# Patient Record
Sex: Male | Born: 1960 | Race: Black or African American | Hispanic: No | Marital: Married | Smoking: Former smoker
Health system: Southern US, Community
[De-identification: ages and names within clinical notes are randomized; demographics above are authoritative.]

## PROBLEM LIST (undated history)

## (undated) DIAGNOSIS — N529 Male erectile dysfunction, unspecified: Secondary | ICD-10-CM

## (undated) DIAGNOSIS — N5089 Other specified disorders of the male genital organs: Secondary | ICD-10-CM

## (undated) DIAGNOSIS — I10 Essential (primary) hypertension: Secondary | ICD-10-CM

## (undated) DIAGNOSIS — E785 Hyperlipidemia, unspecified: Secondary | ICD-10-CM

## (undated) DIAGNOSIS — Z9189 Other specified personal risk factors, not elsewhere classified: Secondary | ICD-10-CM

---

## 1997-10-09 ENCOUNTER — Emergency Department (HOSPITAL_COMMUNITY): Admission: EM | Admit: 1997-10-09 | Discharge: 1997-10-09 | Payer: Self-pay | Admitting: Emergency Medicine

## 2000-10-16 ENCOUNTER — Emergency Department (HOSPITAL_COMMUNITY): Admission: EM | Admit: 2000-10-16 | Discharge: 2000-10-16 | Payer: Self-pay | Admitting: *Deleted

## 2003-03-03 HISTORY — PX: KNEE ARTHROSCOPY: SUR90

## 2004-10-29 ENCOUNTER — Encounter: Admission: RE | Admit: 2004-10-29 | Discharge: 2005-01-27 | Payer: Self-pay | Admitting: Internal Medicine

## 2008-03-02 HISTORY — PX: WISDOM TOOTH EXTRACTION: SHX21

## 2013-09-18 ENCOUNTER — Other Ambulatory Visit: Payer: Self-pay | Admitting: Urology

## 2013-10-02 ENCOUNTER — Encounter (HOSPITAL_BASED_OUTPATIENT_CLINIC_OR_DEPARTMENT_OTHER): Payer: Self-pay | Admitting: *Deleted

## 2013-10-02 NOTE — Progress Notes (Signed)
NPO AFTER MN. ARRIVE AT 1030. NEEDS ISTAT AND EKG.  

## 2013-10-02 NOTE — Progress Notes (Signed)
10/02/13 1308  OBSTRUCTIVE SLEEP APNEA  Have you ever been diagnosed with sleep apnea through a sleep study? No  Do you snore loudly (loud enough to be heard through closed doors)?  0  Do you often feel tired, fatigued, or sleepy during the daytime? 0  Has anyone observed you stop breathing during your sleep? 0  Do you have, or are you being treated for high blood pressure? 1  BMI more than 35 kg/m2? 0  Age over [address]956 years old? 1  Neck circumference greater than 40 cm/16 inches? 1  Gender: 1  Obstructive Sleep Apnea Score 4  Score 4 or greater  Results sent to PCP

## 2013-10-06 ENCOUNTER — Ambulatory Visit (HOSPITAL_BASED_OUTPATIENT_CLINIC_OR_DEPARTMENT_OTHER): Payer: BC Managed Care – PPO | Admitting: Anesthesiology

## 2013-10-06 ENCOUNTER — Other Ambulatory Visit: Payer: Self-pay

## 2013-10-06 ENCOUNTER — Encounter (HOSPITAL_BASED_OUTPATIENT_CLINIC_OR_DEPARTMENT_OTHER): Payer: Self-pay | Admitting: *Deleted

## 2013-10-06 ENCOUNTER — Encounter (HOSPITAL_BASED_OUTPATIENT_CLINIC_OR_DEPARTMENT_OTHER): Payer: BC Managed Care – PPO | Admitting: Anesthesiology

## 2013-10-06 ENCOUNTER — Ambulatory Visit (HOSPITAL_BASED_OUTPATIENT_CLINIC_OR_DEPARTMENT_OTHER)
Admission: RE | Admit: 2013-10-06 | Discharge: 2013-10-06 | Disposition: A | Discharge status: Home / Self Care | Payer: BC Managed Care – PPO | Source: Ambulatory Visit | Attending: Urology | Admitting: Urology

## 2013-10-06 ENCOUNTER — Encounter (HOSPITAL_BASED_OUTPATIENT_CLINIC_OR_DEPARTMENT_OTHER)
Admission: RE | Disposition: A | Discharge status: Home / Self Care | Payer: Self-pay | Source: Ambulatory Visit | Attending: Urology

## 2013-10-06 DIAGNOSIS — N433 Hydrocele, unspecified: Secondary | ICD-10-CM | POA: Diagnosis not present

## 2013-10-06 DIAGNOSIS — N5089 Other specified disorders of the male genital organs: Secondary | ICD-10-CM | POA: Diagnosis not present

## 2013-10-06 DIAGNOSIS — E785 Hyperlipidemia, unspecified: Secondary | ICD-10-CM | POA: Insufficient documentation

## 2013-10-06 DIAGNOSIS — F17200 Nicotine dependence, unspecified, uncomplicated: Secondary | ICD-10-CM | POA: Diagnosis not present

## 2013-10-06 DIAGNOSIS — N508 Other specified disorders of male genital organs: Secondary | ICD-10-CM | POA: Diagnosis present

## 2013-10-06 DIAGNOSIS — I10 Essential (primary) hypertension: Secondary | ICD-10-CM | POA: Insufficient documentation

## 2013-10-06 HISTORY — DX: Other specified disorders of the male genital organs: N50.89

## 2013-10-06 HISTORY — DX: Essential (primary) hypertension: I10

## 2013-10-06 HISTORY — PX: SCROTAL EXPLORATION: SHX2386

## 2013-10-06 HISTORY — DX: Hyperlipidemia, unspecified: E78.5

## 2013-10-06 HISTORY — DX: Other specified personal risk factors, not elsewhere classified: Z91.89

## 2013-10-06 SURGERY — EXPLORATION, SCROTUM
Anesthesia: General | Site: Groin | Laterality: Left

## 2013-10-06 MED ORDER — OXYCODONE-ACETAMINOPHEN 5-325 MG PO TABS: 1.0000 | ORAL_TABLET | ORAL | Status: DC | PRN

## 2013-10-06 MED ORDER — ACETAMINOPHEN 10 MG/ML IV SOLN
INTRAVENOUS | Status: DC | PRN
  Administered 2013-10-06: 1000 mg via INTRAVENOUS

## 2013-10-06 MED ORDER — FENTANYL CITRATE 0.05 MG/ML IJ SOLN
INTRAMUSCULAR | Status: DC | PRN
  Administered 2013-10-06 (×4): 50 ug via INTRAVENOUS

## 2013-10-06 MED ORDER — OXYCODONE HCL 5 MG PO TABS
5.0000 mg | ORAL_TABLET | ORAL | Status: DC | PRN
  Filled 2013-10-06: qty 2

## 2013-10-06 MED ORDER — LACTATED RINGERS IV SOLN
INTRAVENOUS | Status: DC
  Administered 2013-10-06: 11:00:00 via INTRAVENOUS
  Filled 2013-10-06: qty 1000

## 2013-10-06 MED ORDER — FENTANYL CITRATE 0.05 MG/ML IJ SOLN
25.0000 ug | INTRAMUSCULAR | Status: DC | PRN
  Filled 2013-10-06: qty 1

## 2013-10-06 MED ORDER — KETOROLAC TROMETHAMINE 30 MG/ML IJ SOLN
INTRAMUSCULAR | Status: DC | PRN
  Administered 2013-10-06: 30 mg via INTRAVENOUS

## 2013-10-06 MED ORDER — ACETAMINOPHEN 325 MG PO TABS
650.0000 mg | ORAL_TABLET | ORAL | Status: DC | PRN
  Filled 2013-10-06: qty 2

## 2013-10-06 MED ORDER — LACTATED RINGERS IV SOLN
INTRAVENOUS | Status: DC
  Filled 2013-10-06: qty 1000

## 2013-10-06 MED ORDER — PROPOFOL 10 MG/ML IV BOLUS
INTRAVENOUS | Status: DC | PRN
  Administered 2013-10-06: 200 mg via INTRAVENOUS

## 2013-10-06 MED ORDER — SODIUM CHLORIDE 0.9 % IJ SOLN
3.0000 mL | INTRAMUSCULAR | Status: DC | PRN
  Filled 2013-10-06: qty 3

## 2013-10-06 MED ORDER — SODIUM CHLORIDE 0.9 % IV SOLN
250.0000 mL | INTRAVENOUS | Status: DC | PRN
  Filled 2013-10-06: qty 250

## 2013-10-06 MED ORDER — DEXAMETHASONE SODIUM PHOSPHATE 4 MG/ML IJ SOLN
INTRAMUSCULAR | Status: DC | PRN
  Administered 2013-10-06: 10 mg via INTRAVENOUS

## 2013-10-06 MED ORDER — LIDOCAINE HCL (CARDIAC) 20 MG/ML IV SOLN
INTRAVENOUS | Status: DC | PRN
  Administered 2013-10-06: 100 mg via INTRAVENOUS

## 2013-10-06 MED ORDER — MORPHINE SULFATE 2 MG/ML IJ SOLN
2.0000 mg | INTRAMUSCULAR | Status: DC | PRN
  Filled 2013-10-06: qty 1

## 2013-10-06 MED ORDER — FENTANYL CITRATE 0.05 MG/ML IJ SOLN
INTRAMUSCULAR | Status: AC
  Filled 2013-10-06: qty 4

## 2013-10-06 MED ORDER — SODIUM CHLORIDE 0.9 % IJ SOLN
3.0000 mL | Freq: Two times a day (BID) | INTRAMUSCULAR | Status: DC
  Filled 2013-10-06: qty 3

## 2013-10-06 MED ORDER — MIDAZOLAM HCL 5 MG/5ML IJ SOLN
INTRAMUSCULAR | Status: DC | PRN
  Administered 2013-10-06: 2 mg via INTRAVENOUS

## 2013-10-06 MED ORDER — MIDAZOLAM HCL 2 MG/2ML IJ SOLN
INTRAMUSCULAR | Status: AC
Start: 2013-10-06 — End: 2013-10-06
  Filled 2013-10-06: qty 2

## 2013-10-06 MED ORDER — ACETAMINOPHEN 650 MG RE SUPP
650.0000 mg | RECTAL | Status: DC | PRN
  Filled 2013-10-06: qty 1

## 2013-10-06 MED ORDER — CEFAZOLIN SODIUM 1-5 GM-% IV SOLN
1.0000 g | INTRAVENOUS | Status: DC
  Filled 2013-10-06: qty 50

## 2013-10-06 MED ORDER — CEFAZOLIN SODIUM-DEXTROSE 2-3 GM-% IV SOLR
2.0000 g | INTRAVENOUS | Status: AC
  Administered 2013-10-06: 2 g via INTRAVENOUS
  Filled 2013-10-06: qty 50

## 2013-10-06 MED ORDER — 0.9 % SODIUM CHLORIDE (POUR BTL) OPTIME
TOPICAL | Status: DC | PRN
  Administered 2013-10-06: 1000 mL

## 2013-10-06 MED ORDER — BUPIVACAINE HCL (PF) 0.25 % IJ SOLN
INTRAMUSCULAR | Status: DC | PRN
  Administered 2013-10-06: 10 mL

## 2013-10-06 MED ORDER — ONDANSETRON HCL 4 MG/2ML IJ SOLN
INTRAMUSCULAR | Status: DC | PRN
  Administered 2013-10-06: 4 mg via INTRAVENOUS

## 2013-10-06 SURGICAL SUPPLY — 61 items
ADH SKN CLS APL DERMABOND .7 (GAUZE/BANDAGES/DRESSINGS)
APL SKNCLS STERI-STRIP NONHPOA (GAUZE/BANDAGES/DRESSINGS)
APPLICATOR COTTON TIP 6IN STRL (MISCELLANEOUS) IMPLANT
BENZOIN TINCTURE PRP APPL 2/3 (GAUZE/BANDAGES/DRESSINGS) IMPLANT
BLADE CLIPPER SURG (BLADE) ×4 IMPLANT
BLADE SURG 15 STRL LF DISP TIS (BLADE) ×2 IMPLANT
BLADE SURG 15 STRL SS (BLADE) ×4
BNDG GAUZE ELAST 4 BULKY (GAUZE/BANDAGES/DRESSINGS) ×4 IMPLANT
CANISTER SUCTION 1200CC (MISCELLANEOUS) ×1 IMPLANT
CLEANER CAUTERY TIP 5X5 PAD (MISCELLANEOUS) ×2 IMPLANT
CLOSURE WOUND 1/2 X4 (GAUZE/BANDAGES/DRESSINGS)
CLOTH BEACON ORANGE TIMEOUT ST (SAFETY) ×1 IMPLANT
COVER MAYO STAND STRL (DRAPES) ×4 IMPLANT
COVER TABLE BACK 60X90 (DRAPES) ×4 IMPLANT
DERMABOND ADVANCED (GAUZE/BANDAGES/DRESSINGS)
DERMABOND ADVANCED .7 DNX12 (GAUZE/BANDAGES/DRESSINGS) ×1 IMPLANT
DISSECTOR ROUND CHERRY 3/8 STR (MISCELLANEOUS) IMPLANT
DRAIN PENROSE 18X1/4 LTX STRL (WOUND CARE) ×3 IMPLANT
DRAPE PED LAPAROTOMY (DRAPES) ×4 IMPLANT
DRSG TEGADERM 4X4.75 (GAUZE/BANDAGES/DRESSINGS) ×3 IMPLANT
DRSG TELFA 3X8 NADH (GAUZE/BANDAGES/DRESSINGS) ×4 IMPLANT
ELECT NDL TIP 2.8 STRL (NEEDLE) ×1 IMPLANT
ELECT NEEDLE TIP 2.8 STRL (NEEDLE) IMPLANT
ELECT REM PT RETURN 9FT ADLT (ELECTROSURGICAL) ×4
ELECTRODE REM PT RTRN 9FT ADLT (ELECTROSURGICAL) ×2 IMPLANT
GAUZE SPONGE 4X4 12PLY STRL LF (GAUZE/BANDAGES/DRESSINGS) ×2 IMPLANT
GAUZE SPONGE 4X4 16PLY XRAY LF (GAUZE/BANDAGES/DRESSINGS) IMPLANT
GLOVE BIO SURGEON STRL SZ8 (GLOVE) ×4 IMPLANT
GLOVE BIOGEL M STER SZ 6 (GLOVE) ×3 IMPLANT
GLOVE BIOGEL PI IND STRL 6.5 (GLOVE) ×1 IMPLANT
GLOVE BIOGEL PI INDICATOR 6.5 (GLOVE) ×2
GOWN STRL REIN XL XLG (GOWN DISPOSABLE) ×1 IMPLANT
GOWN STRL REUS W/TWL LRG LVL3 (GOWN DISPOSABLE) ×4 IMPLANT
GOWN STRL REUS W/TWL XL LVL3 (GOWN DISPOSABLE) ×4 IMPLANT
NEEDLE HYPO 22GX1.5 SAFETY (NEEDLE) IMPLANT
NS IRRIG 500ML POUR BTL (IV SOLUTION) ×4 IMPLANT
PACK BASIN DAY SURGERY FS (CUSTOM PROCEDURE TRAY) ×4 IMPLANT
PAD CLEANER CAUTERY TIP 5X5 (MISCELLANEOUS) ×2
PAD DRESSING TELFA 3X8 NADH (GAUZE/BANDAGES/DRESSINGS) IMPLANT
PENCIL BUTTON HOLSTER BLD 10FT (ELECTRODE) ×4 IMPLANT
STRIP CLOSURE SKIN 1/2X4 (GAUZE/BANDAGES/DRESSINGS) IMPLANT
SUPPORT SCROTAL LG STRP (MISCELLANEOUS) ×3 IMPLANT
SUPPORTER ATHLETIC LG (MISCELLANEOUS) ×1
SUT CHROMIC 2 0 SH (SUTURE) IMPLANT
SUT CHROMIC 3 0 SH 27 (SUTURE) IMPLANT
SUT CHROMIC 4 0 SH 27 (SUTURE) IMPLANT
SUT MNCRL AB 4-0 PS2 18 (SUTURE) ×7 IMPLANT
SUT SILK 0 TIES 10X30 (SUTURE) ×1 IMPLANT
SUT VIC AB 3-0 SH 27 (SUTURE) ×8
SUT VIC AB 3-0 SH 27X BRD (SUTURE) ×3 IMPLANT
SUT VICRYL 2 0 18  UND BR (SUTURE)
SUT VICRYL 2 0 18 UND BR (SUTURE) IMPLANT
SYR BULB IRRIGATION 50ML (SYRINGE) ×4 IMPLANT
SYRINGE CONTROL L 12CC (SYRINGE) ×4 IMPLANT
SYRINGE CONTROL LL 12CC (SYRINGE) ×1 IMPLANT
TOWEL OR 17X24 6PK STRL BLUE (TOWEL DISPOSABLE) ×8 IMPLANT
TRAY DSU PREP LF (CUSTOM PROCEDURE TRAY) ×4 IMPLANT
TUBE CONNECTING 12'X1/4 (SUCTIONS) ×1
TUBE CONNECTING 12X1/4 (SUCTIONS) ×3 IMPLANT
WATER STERILE IRR 500ML POUR (IV SOLUTION) IMPLANT
YANKAUER SUCT BULB TIP NO VENT (SUCTIONS) ×1 IMPLANT

## 2013-10-06 NOTE — Transfer of Care (Signed)
Immediate Anesthesia Transfer of Care Note  Patient: Joseph CorningJoseph T Camacho  Procedure(s) Performed: Procedure(s) (LRB): INGUINAL EXPLORATION, BIOPSY OF SCROTAL MASS, LEFT HYDROCELECTOMY  (Left)  Patient Location: PACU  Anesthesia Type: General  Level of Consciousness: awake, alert  and oriented  Airway & Oxygen Therapy: Patient Spontanous Breathing and Patient connected to face mask oxygen  Post-op Assessment: Report given to PACU RN and Post -op Vital signs reviewed and stable  Post vital signs: Reviewed and stable  Complications: No apparent anesthesia complications

## 2013-10-06 NOTE — Anesthesia Preprocedure Evaluation (Addendum)
Anesthesia Evaluation  Patient identified by MRN, date of birth, ID band Patient awake    Reviewed: Allergy & Precautions, H&P , NPO status , Patient's Chart, lab work & pertinent test results  Airway Mallampati: III TM Distance: >3 FB Neck ROM: full    Dental no notable dental hx. (+) Teeth Intact, Dental Advisory Given   Pulmonary neg pulmonary ROS, Current Smoker,  Stop bang 4 breath sounds clear to auscultation  Pulmonary exam normal       Cardiovascular Exercise Tolerance: Good hypertension, Pt. on medications negative cardio ROS  Rhythm:regular Rate:Normal     Neuro/Psych negative neurological ROS  negative psych ROS   GI/Hepatic negative GI ROS, Neg liver ROS,   Endo/Other  negative endocrine ROS  Renal/GU negative Renal ROS  negative genitourinary   Musculoskeletal   Abdominal   Peds  Hematology negative hematology ROS (+)   Anesthesia Other Findings   Reproductive/Obstetrics negative OB ROS                          Anesthesia Physical Anesthesia Plan  ASA: III  Anesthesia Plan: General   Post-op Pain Management:    Induction: Intravenous  Airway Management Planned: LMA  Additional Equipment:   Intra-op Plan:   Post-operative Plan:   Informed Consent: I have reviewed the patients History and Physical, chart, labs and discussed the procedure including the risks, benefits and alternatives for the proposed anesthesia with the patient or authorized representative who has indicated his/her understanding and acceptance.   Dental Advisory Given  Plan Discussed with: CRNA and Surgeon  Anesthesia Plan Comments:         Anesthesia Quick Evaluation

## 2013-10-06 NOTE — Anesthesia Postprocedure Evaluation (Signed)
  Anesthesia Post-op Note  Patient: Joseph Camacho  Procedure(s) Performed: Procedure(s) (LRB): INGUINAL EXPLORATION, BIOPSY OF SCROTAL MASS, LEFT HYDROCELECTOMY  (Left)  Patient Location: PACU  Anesthesia Type: General  Level of Consciousness: awake and alert   Airway and Oxygen Therapy: Patient Spontanous Breathing  Post-op Pain: mild  Post-op Assessment: Post-op Vital signs reviewed, Patient's Cardiovascular Status Stable, Respiratory Function Stable, Patent Airway and No signs of Nausea or vomiting  Last Vitals:  Filed Vitals:   10/06/13 1500  BP: 139/92  Pulse: 71  Temp: 36.8 C  Resp: 14    Post-op Vital Signs: stable   Complications: No apparent anesthesia complications

## 2013-10-06 NOTE — H&P (Signed)
Urology History and Physical Exam  CC: left scrotal mass  HPI: 53 year old male presents at this time for left inguinal exploration and excision of left paratesticular mass.  The patient originally presented a few months ago for this.  He had bilateral hydroceles, as well as a left paratesticular mass noted on ultrasound.  This mass measured 4 cm in size.the patient delayed follow-up, and was seen recently.  It was recommended that he have this abnormality biopsied or removed through an inguinal exploration.  He presents at this time for that procedure.  PMH: Past Medical History  Diagnosis Date  . Scrotal mass     left  . Hypertension   . Hyperlipidemia   . At risk for sleep apnea     STOP-BANG= 4     SENT TO PCP 10-02-2013    PSH: Past Surgical History  Procedure Laterality Date  . Knee arthroscopy Right 2005  . Wisdom tooth extraction  2010    Allergies: No Known Allergies  Medications: Prescriptions prior to admission  Medication Sig Dispense Refill  . amLODipine-benazepril (LOTREL) 10-20 MG per capsule Take 1 capsule by mouth every morning.      Marland Kitchen atorvastatin (LIPITOR) 20 MG tablet Take 20 mg by mouth every morning.         Social History: History   Social History  . Marital Status: Married    Spouse Name: N/A    Number of Children: N/A  . Years of Education: N/A   Occupational History  . Not on file.   Social History Main Topics  . Smoking status: Current Some Day Smoker  . Smokeless tobacco: Never Used     Comment: occasional social smoker  . Alcohol Use: Yes     Comment: rare  . Drug Use: No  . Sexual Activity: Not on file   Other Topics Concern  . Not on file   Social History Narrative  . No narrative on file    Family History: History reviewed. No pertinent family history.  Review of Systems: Genitourinary, constitutional, skin, eye, otolaryngeal, hematologic/lymphatic, cardiovascular, pulmonary, endocrine, musculoskeletal,  gastrointestinal, neurological and psychiatric system(s) were reviewed and pertinent findings if present are noted.  Genitourinary: nocturia, erectile dysfunction and scrotal swelling.  Gastrointestinal: heartburn.  Neurological: headache.                   Physical Exam: @VITALS2 @ Constitutional: Well nourished and well developed . No acute distress.  ENT:. The ears and nose are normal in appearance.  Neck: The appearance of the neck is normal and no neck mass is present.  Pulmonary: No respiratory distress and normal respiratory rhythm and effort.  Cardiovascular: Heart rate and rhythm are normal . No peripheral edema.  Abdomen: The abdomen is rounded. The abdomen is soft and nontender. No masses are palpated. No CVA tenderness. No hernias are palpable. No hepatosplenomegaly noted.  Rectal: Rectal exam demonstrates normal sphincter tone, the anus is normal on inspection., no tenderness and no masses. Estimated prostate size is 1+. Normal rectal tone, no rectal masses, prostate is smooth, symmetric and non-tender. The prostate has no nodularity and is not tender. The left seminal vesicle is nonpalpable. The right seminal vesicle is nonpalpable. The perineum is normal on inspection.  Genitourinary: The penis is circumcised. The scrotum is normal in appearance. Examination of the right scrotum demonstrates a hydrocele. Examination of the left scrotum demostrates no hydrocele. The right vas deferens is is palpably normal. The left vas deferens is  palpably normal. The right spermatic cord is palpably normal. The left spermatic cord is palpably normal. The right testis is nonpalpable. The left testis is normal. Left testicle is somewhat enlarged and uniformly firm. No specific masses are noted.  Lymphatics: The femoral and inguinal nodes are not enlarged or tender.  Skin: Normal skin turgor, no visible rash and no visible skin lesions.  Neuro/Psych:. Mood and affect are appropriate.  Studies:  No  results found for this basename: HGB, WBC, PLT,  in the last 72 hours  No results found for this basename: NA, K, CL, CO2, BUN, CREATININE, CALCIUM, MAGNESIUM, GFRNONAA, GFRAA,  in the last 72 hours   No results found for this basename: PT, INR, APTT,  in the last 72 hours   No components found with this basename: ABG,     Assessment:  Left paratesticular mass  Plan: Left inguinal exploration, excisional biopsy of mass, possible left inguinal orchiectomy

## 2013-10-06 NOTE — Discharge Instructions (Signed)
Scrotal surgery postoperative instructions  Wound:  In most cases your incision will have absorbable sutures that will dissolve within the first 2-3 weeks. Some will fall out even earlier. Expect some redness as the sutures dissolve but this should occur only around the sutures. If there is generalized redness, especially with increasing pain or swelling, let us know. The scrotum will very likely get "black and blue" as the blood in the tissues spread. Sometimes the whole scrotum will turn colors. The black and blue is followed by a yellow and brown color. In time, all the discoloration will go away. In some cases some firm swelling in the area of the testicle may persist for up to 4-6 weeks after the surgery and is considered normal in most cases.  Drain:  If the surgeon placed a drain through the bottom part of your scrotum, it is held in with a small stitch. When instructed, cut the small stitch and slide to drain out. It is located remove the drain on Tuesday. Once the drain has been removed, a small hole made drain out for another day or 2. If so, keep a clean washcloth underneath your supportive undergarment, or sterile gauze. Until the hole seals up, all bathing should be in the shower, and not in the bathtub.  Diet:  You may return to your normal diet within 24 hours following your surgery. You may note some mild nausea and possibly vomiting the first 6-8 hours following surgery. This is usually due to the side effects of anesthesia, and will disappear quite soon. I would suggest clear liquids and a very light meal the first evening following your surgery.  Activity:  Your physical activity should be restricted the first 48 hours. During that time you should remain relatively inactive, moving about only when necessary. During the first 7-10 days following surgery you should avoid lifting any heavy objects (anything greater than 15 pounds), and avoid strenuous exercise. If you work, ask [place]  specifically about your restrictions, both for work and home. We will write a note to your employer if needed.  You should plan to wear a tight pair of jockey shorts or an athletic supporter for the first 4-5 days, even to sleep. This will keep the scrotum immobilized to some degree and keep the swelling down. You may find it more comfortable to wear a support longer.  Ice packs should be placed on and off over the scrotum for the first 48 hours. Frozen peas or corn in a ZipLock bag can be frozen, used and re-frozen. Fifteen minutes on and 15 minutes off is a reasonable schedule. The ice is a good pain reliever and keeps the swelling down.  Hygiene:  You may shower 48 hours after your surgery. Tub bathing should be restricted until the seventh day.          Medication:  You will be sent home with some type of pain medication. In many cases you will be sent home with a narcotic pain pill (Vicodin or Tylox). If the pain is not too bad, you may take either Tylenol (acetaminophen) or Advil (ibuprofen) which contain no narcotic agents, and might be tolerated a little better, with fewer side effects. If the pain medication you are sent home with does not control the pain, you will have to let us know. Some narcotic pain medications cannot be given or refilled by a phone call to a pharmacy.  Problems you should report to [place]:   Fever of 101.0 degrees  Fahrenheit or greater.  Moderate or severe swelling under the skin incision or involving the scrotum.  Drug reaction such as hives, a rash, nausea or vomiting.      Post Anesthesia Home Care Instructions  Activity: Get plenty of rest for the remainder of the day. A responsible adult should stay with you for 24 hours following the procedure.  For the next 24 hours, DO NOT: -Drive a car -Advertising copywriterperate machinery -Drink alcoholic beverages -Take any medication unless instructed by your physician -Make any legal decisions or sign important  papers.  Meals: Start with liquid foods such as gelatin or soup. Progress to regular foods as tolerated. Avoid greasy, spicy, heavy foods. If nausea and/or vomiting occur, drink only clear liquids until the nausea and/or vomiting subsides. Call your physician if vomiting continues.  Special Instructions/Symptoms: Your throat may feel dry or sore from the anesthesia or the breathing tube placed in your throat during surgery. If this causes discomfort, gargle with warm salt water. The discomfort should disappear within 24 hours.

## 2013-10-09 ENCOUNTER — Encounter (HOSPITAL_BASED_OUTPATIENT_CLINIC_OR_DEPARTMENT_OTHER): Payer: Self-pay | Admitting: Urology

## 2013-10-09 LAB — POCT I-STAT, CHEM 8
BUN: 18 mg/dL (ref 6–23)
Calcium, Ion: 1.17 mmol/L (ref 1.12–1.23)
Chloride: 102 mEq/L (ref 96–112)
Creatinine, Ser: 0.9 mg/dL (ref 0.50–1.35)
Glucose, Bld: 99 mg/dL (ref 70–99)
HCT: 48 % (ref 39.0–52.0)
Hemoglobin: 16.3 g/dL (ref 13.0–17.0)
Potassium: 4 mEq/L (ref 3.7–5.3)
Sodium: 140 mEq/L (ref 137–147)
TCO2: 26 mmol/L (ref 0–100)

## 2013-10-12 NOTE — Op Note (Addendum)
Preoperative diagnosis: Left paratesticular mass  Postoperative diagnosis: Left hematocele   Procedure: Inguinal expiration on the left, testicular exploration, excision of left hematocele    Surgeon: Bertram MillardStephen M. Junaid Wurzer, M.D.   Anesthesia: Gen.   Complications: None  Specimen(s): Left hematocele fluid for cytology  Drain(s): Quarter-inch Penrose drain in left hemiscrotum  Indications:53 year old male presents at this time for left inguinal exploration and excision of left paratesticular mass. The patient originally presented a few months ago for this. He had bilateral hydroceles, as well as a left paratesticular mass noted on ultrasound. This mass measured 4 cm in size.the patient delayed follow-up, and was seen recently. It was recommended that he have this abnormality biopsied or removed through an inguinal exploration. He presents at this time for that procedure.     Technique and findings: The patient was properly identified in the holding area,  in the operative side marked. He received preoperative IV antibiotics. He was taken to the operating room where  general anesthetic was administered with the LMA. He was placed in the recumbent position. Genitalia, perineum and lower abdomen were prepped and draped. Proper timeout was performed.  I created a 4 cm incision overlying the left external inguinal ring. This incision was carried down through subcutaneous tissues with combined blunt and the like the cautery dissection. The cord was identified from the external inguinal ring. It was encircled after circumferential dissection performed bluntly. A Penrose drain was placed around it. Using gentle traction on the cord, the testicle was brought up into the operative incision. Circumferential dissection was performed around the tunica vaginalis . Gubernaculum was divided with electrocautery, taking great caution to avoid injury to the scrotal skin. The tunica vaginalis externally appeared  normal. The tunica vaginalis was incised and opened. A minimal hydrocele was noted. There is a separate cystic compartment which, upon opening, was found to have light brownish tissue with opalescent small particles. This was felt to be resolving hematocele. Fluid was sent for cytology. I marsupialized this hematocele. The walls were slightly thickened, but I saw no evident neoplasms present. The entire testicle was carefully inspected. It was found to be normal. I used 3-0 chromic to marsupialize the hematocele, attaching it to the open-end tunica vaginalis wall. In this manner, this would prevent reaccumulation of this hematocele/hydrocele. I blocked the cord with 10 mL of quarter percent plain Marcaine. I then everted the scrotum into the wound. No bleeding was seen. At this point, I created a small incision on the lower left hemiscrotum, and using a clamp, brought the quarter-inch Penrose drain through the lower scrotum. It was sutured to the scrotal wall with a 3-0 chromic. After the inguinal incision was found to be hemostatic, I blocked the subcutaneous tissue as well with quarter percent plain Marcaine. The Penrose drain was removed from around the proximal spermatic cord. Subcutaneous tissues were reapproximated with chromic, and Monocryl was used to close the inguinal incision with and running subcuticullar suture. Dermabond was placed.  The patient tolerated procedure well. He was awakened and taken to the PACU in stable condition.

## 2013-11-09 ENCOUNTER — Ambulatory Visit: Payer: Self-pay

## 2013-11-09 ENCOUNTER — Other Ambulatory Visit: Payer: Self-pay | Admitting: Occupational Medicine

## 2013-11-09 DIAGNOSIS — M25562 Pain in left knee: Secondary | ICD-10-CM

## 2013-11-09 DIAGNOSIS — R52 Pain, unspecified: Secondary | ICD-10-CM

## 2015-03-03 HISTORY — PX: ORIF WRIST FRACTURE: SHX2133

## 2015-10-20 IMAGING — CR DG KNEE COMPLETE 4+V*L*
4 series · 4 of 4 positions shown · non-contrast
Comparison: None.

CLINICAL DATA: Fall.  Pain.

EXAM:
LEFT KNEE - COMPLETE 4+ VIEW

[view not recorded (1 of 4)]
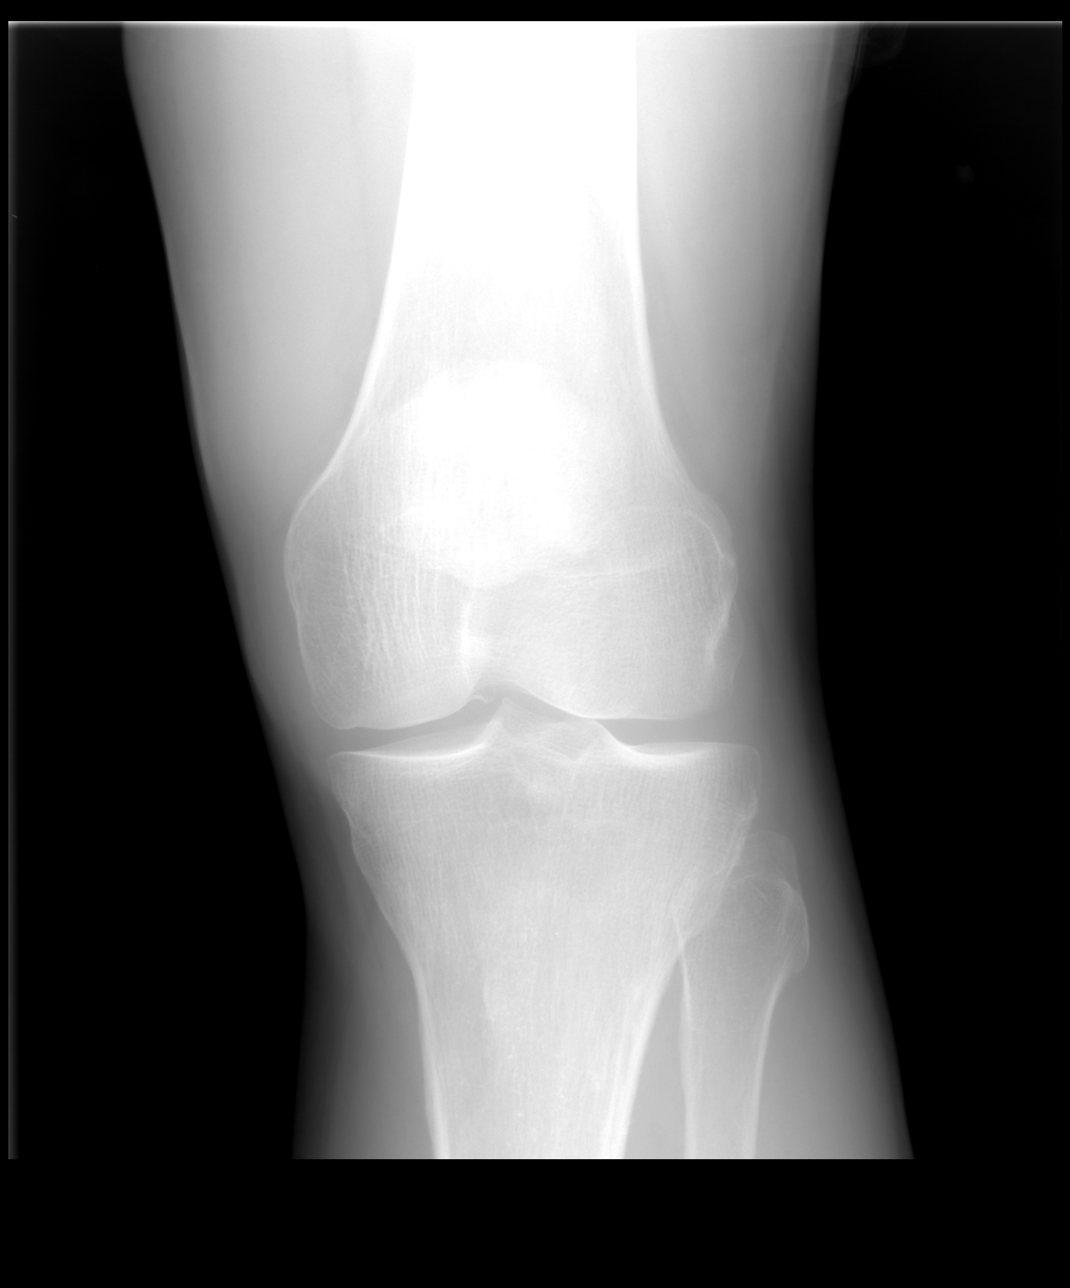

[view not recorded (2 of 4)]
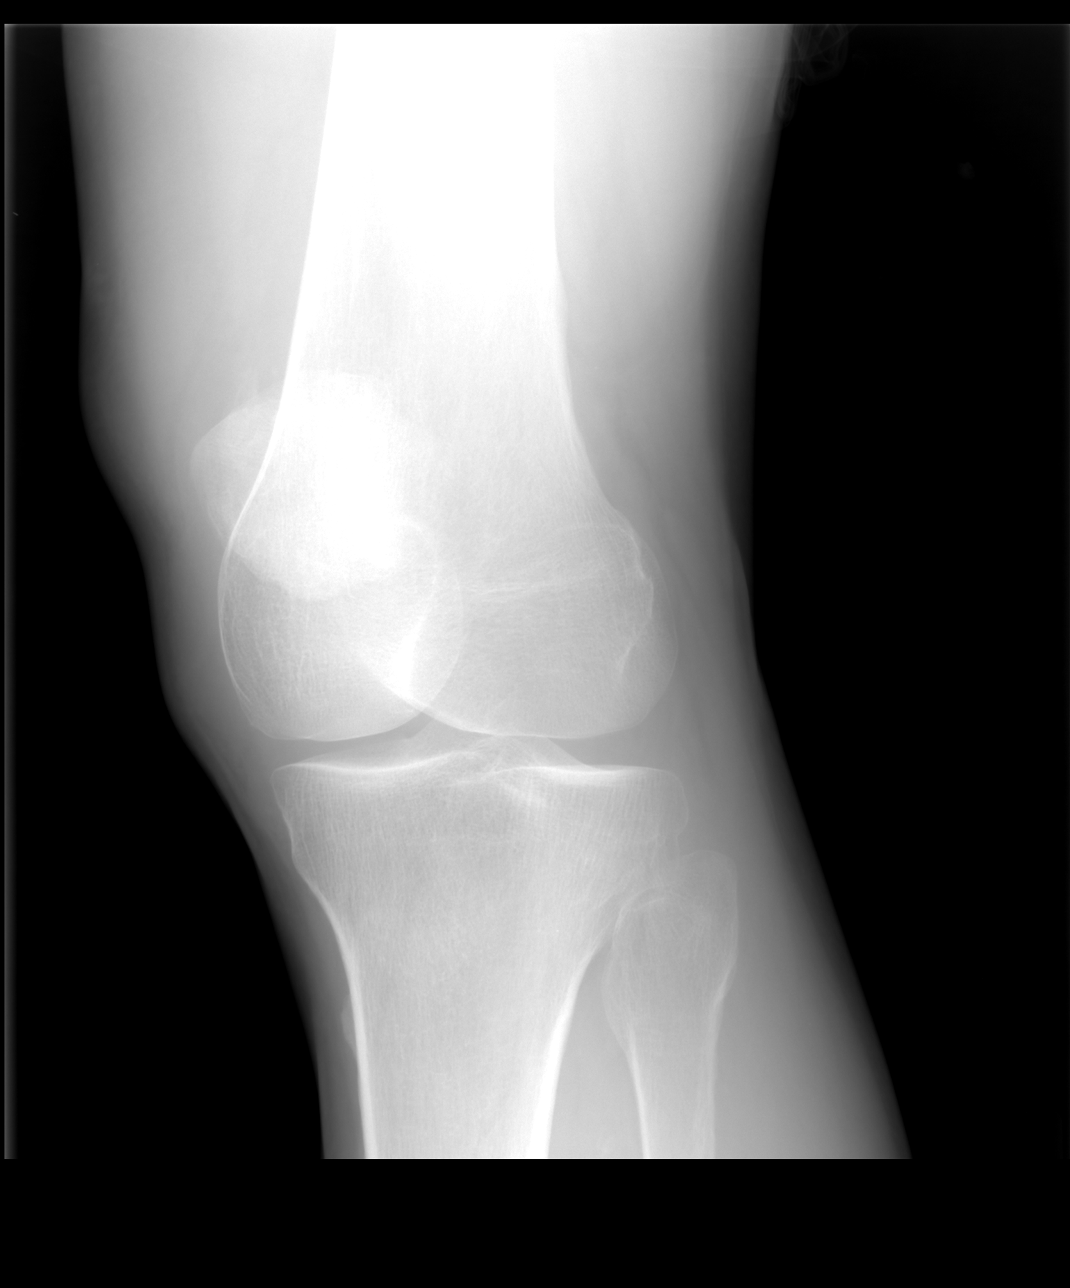

[view not recorded (3 of 4)]
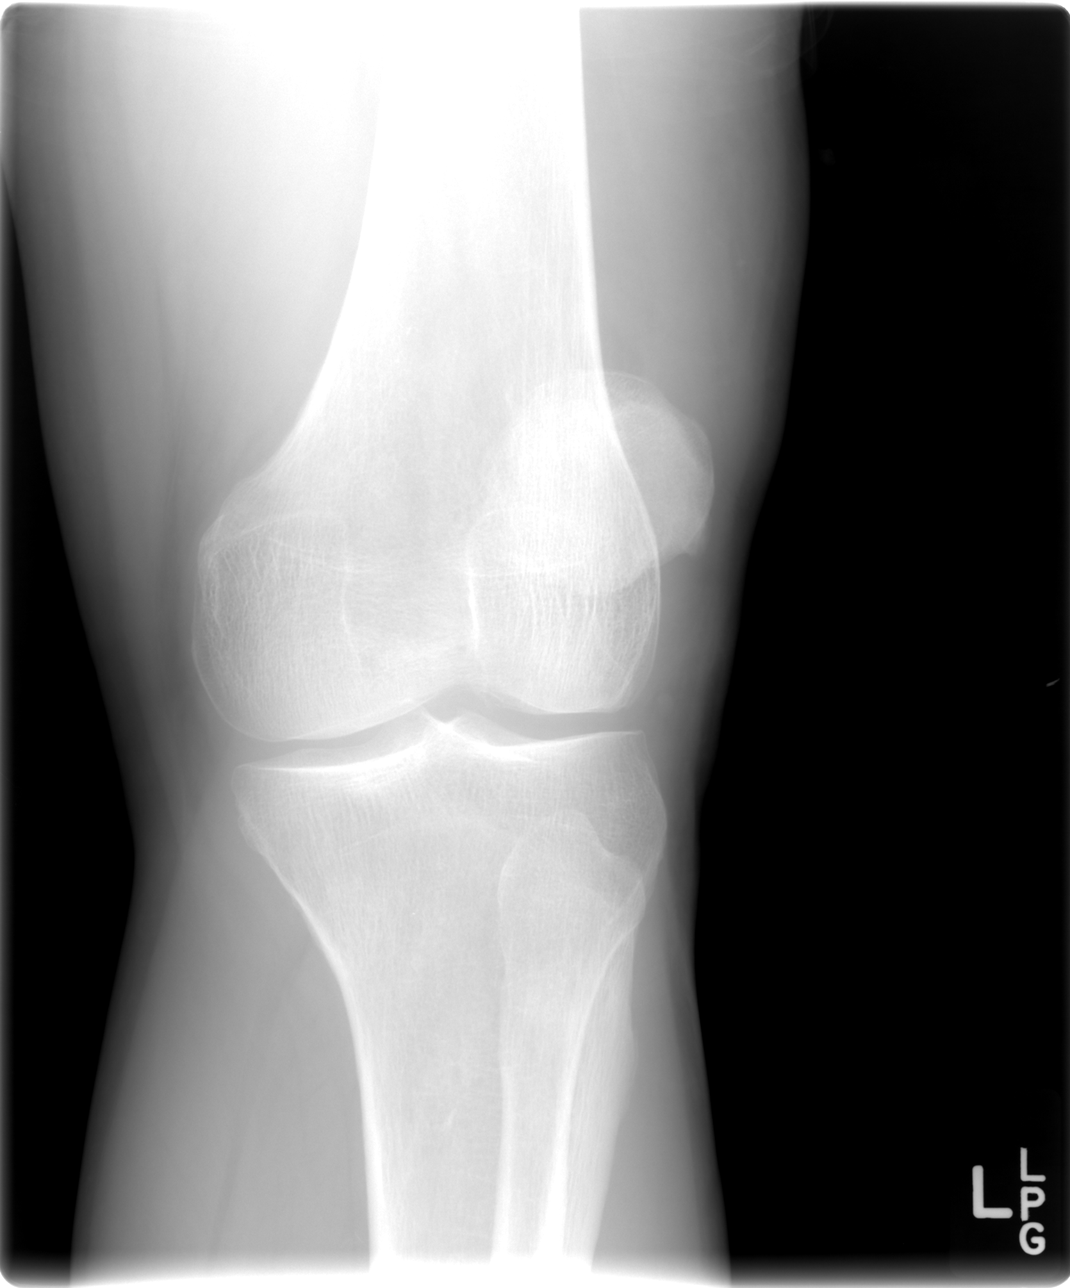

[view not recorded (4 of 4)]
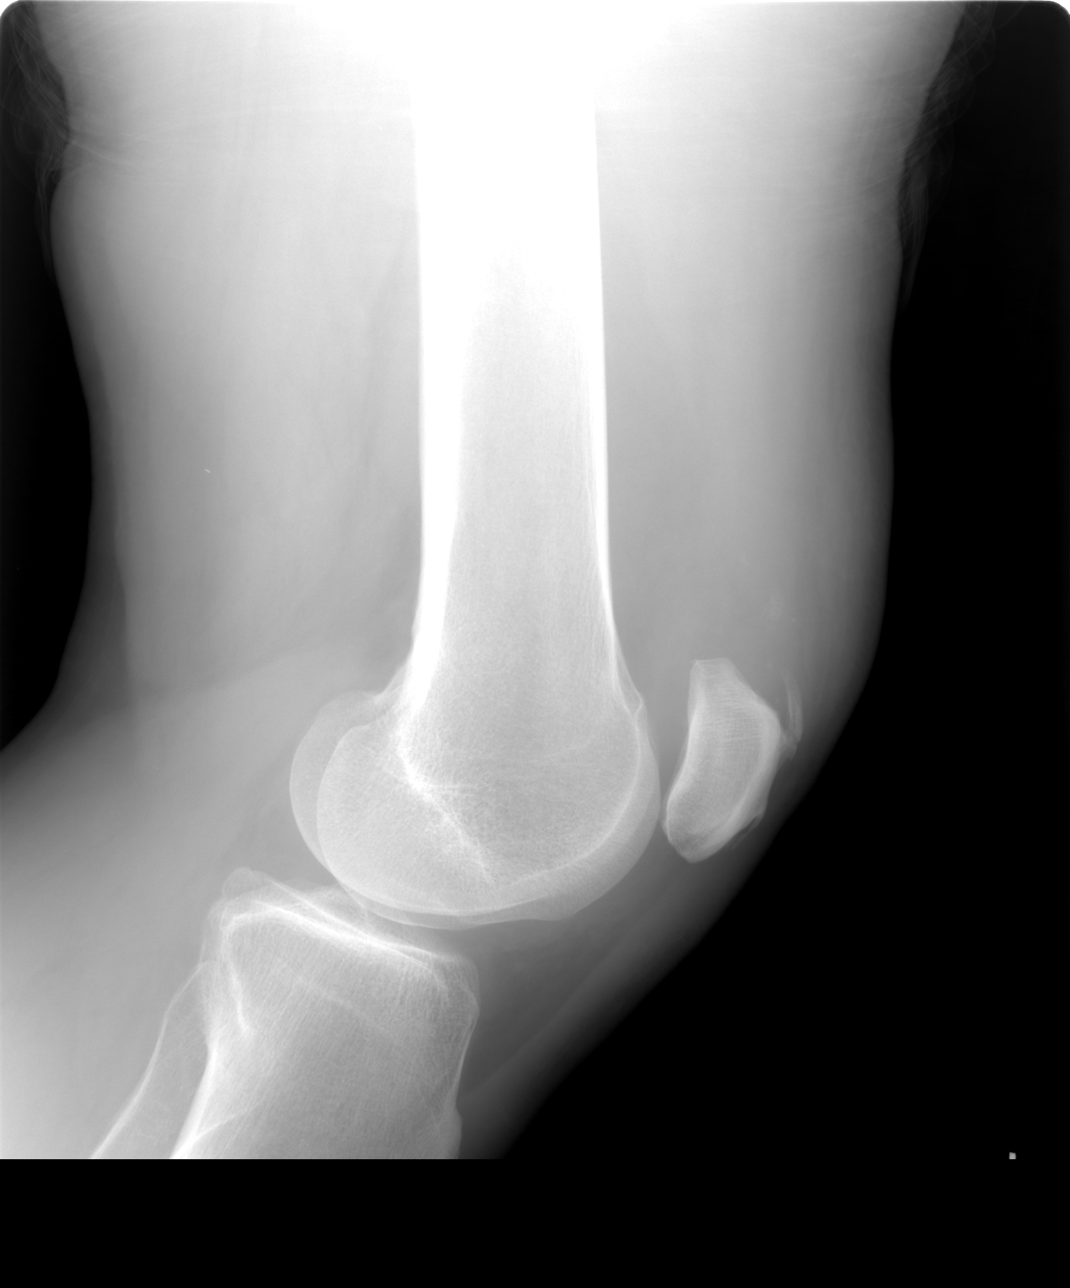

[4 of 4 positions shown; findings below may reference images not displayed]

FINDINGS: Knee joint fusion cannot be excluded. Ossification noted in the
anterior aspect of the superior portion of the patella most likely
tendinous. The possibility of a fracture from the anterior superior
aspect of the patella cannot be completely excluded. Mild medial
compartment and patellofemoral compartment degenerative change
present. No focal abnormality otherwise noted.
IMPRESSION: 1. Corticated bony density arising from the anterior superior aspect
of the patella. This is most likely old and within the tendon.
Clinical correlation to exclude acute fracture along the superior
surface of the patella suggested.
2. Knee joint effusion cannot be excluded.
3. Mild degenerative changes noted about the medial and
patellofemoral compartments.

## 2016-12-09 DIAGNOSIS — I10 Essential (primary) hypertension: Secondary | ICD-10-CM | POA: Diagnosis not present

## 2016-12-09 DIAGNOSIS — N529 Male erectile dysfunction, unspecified: Secondary | ICD-10-CM | POA: Diagnosis not present

## 2017-05-13 DIAGNOSIS — R8290 Unspecified abnormal findings in urine: Secondary | ICD-10-CM | POA: Diagnosis not present

## 2017-06-21 DIAGNOSIS — Z0000 Encounter for general adult medical examination without abnormal findings: Secondary | ICD-10-CM | POA: Diagnosis not present

## 2017-06-21 DIAGNOSIS — R509 Fever, unspecified: Secondary | ICD-10-CM | POA: Diagnosis not present

## 2017-06-21 DIAGNOSIS — I10 Essential (primary) hypertension: Secondary | ICD-10-CM | POA: Diagnosis not present

## 2017-06-21 DIAGNOSIS — N529 Male erectile dysfunction, unspecified: Secondary | ICD-10-CM | POA: Diagnosis not present

## 2017-06-21 DIAGNOSIS — Z125 Encounter for screening for malignant neoplasm of prostate: Secondary | ICD-10-CM | POA: Diagnosis not present

## 2017-06-21 DIAGNOSIS — E7800 Pure hypercholesterolemia, unspecified: Secondary | ICD-10-CM | POA: Diagnosis not present

## 2017-10-26 DIAGNOSIS — R8290 Unspecified abnormal findings in urine: Secondary | ICD-10-CM | POA: Diagnosis not present

## 2017-12-17 DIAGNOSIS — Z125 Encounter for screening for malignant neoplasm of prostate: Secondary | ICD-10-CM | POA: Diagnosis not present

## 2017-12-17 DIAGNOSIS — I10 Essential (primary) hypertension: Secondary | ICD-10-CM | POA: Diagnosis not present

## 2017-12-28 DIAGNOSIS — R8279 Other abnormal findings on microbiological examination of urine: Secondary | ICD-10-CM | POA: Diagnosis not present

## 2018-03-22 DIAGNOSIS — H0012 Chalazion right lower eyelid: Secondary | ICD-10-CM | POA: Diagnosis not present

## 2018-03-22 DIAGNOSIS — H0011 Chalazion right upper eyelid: Secondary | ICD-10-CM | POA: Diagnosis not present

## 2018-03-29 DIAGNOSIS — H0012 Chalazion right lower eyelid: Secondary | ICD-10-CM | POA: Diagnosis not present

## 2018-03-29 DIAGNOSIS — H0011 Chalazion right upper eyelid: Secondary | ICD-10-CM | POA: Diagnosis not present

## 2018-04-19 DIAGNOSIS — H0011 Chalazion right upper eyelid: Secondary | ICD-10-CM | POA: Diagnosis not present

## 2018-04-19 DIAGNOSIS — H0012 Chalazion right lower eyelid: Secondary | ICD-10-CM | POA: Diagnosis not present

## 2018-09-01 DIAGNOSIS — E7800 Pure hypercholesterolemia, unspecified: Secondary | ICD-10-CM | POA: Diagnosis not present

## 2018-09-01 DIAGNOSIS — I10 Essential (primary) hypertension: Secondary | ICD-10-CM | POA: Diagnosis not present

## 2018-09-01 DIAGNOSIS — Z0000 Encounter for general adult medical examination without abnormal findings: Secondary | ICD-10-CM | POA: Diagnosis not present

## 2020-05-07 ENCOUNTER — Other Ambulatory Visit: Payer: Self-pay | Admitting: Urology

## 2020-07-11 ENCOUNTER — Ambulatory Visit (HOSPITAL_BASED_OUTPATIENT_CLINIC_OR_DEPARTMENT_OTHER): Admit: 2020-07-11 | Payer: 59 | Admitting: Urology

## 2020-07-11 ENCOUNTER — Encounter (HOSPITAL_BASED_OUTPATIENT_CLINIC_OR_DEPARTMENT_OTHER): Payer: Self-pay

## 2020-07-11 SURGERY — INSERTION, PENILE PROSTHESIS, INFLATABLE
Anesthesia: General

## 2020-07-24 ENCOUNTER — Other Ambulatory Visit: Payer: Self-pay | Admitting: Urology

## 2020-09-27 ENCOUNTER — Encounter (HOSPITAL_BASED_OUTPATIENT_CLINIC_OR_DEPARTMENT_OTHER): Payer: Self-pay | Admitting: Urology

## 2020-10-01 ENCOUNTER — Other Ambulatory Visit: Payer: Self-pay | Admitting: Urology

## 2020-10-01 LAB — SARS CORONAVIRUS 2 (TAT 6-24 HRS): SARS Coronavirus 2: NEGATIVE

## 2020-10-02 ENCOUNTER — Other Ambulatory Visit: Payer: Self-pay

## 2020-10-02 ENCOUNTER — Encounter (HOSPITAL_BASED_OUTPATIENT_CLINIC_OR_DEPARTMENT_OTHER): Payer: Self-pay | Admitting: Urology

## 2020-10-02 MED ORDER — VANCOMYCIN HCL 1 G IV SOLR
Freq: Once | INTRAVENOUS | Status: AC
  Administered 2020-10-03: 1000 mL
  Filled 2020-10-02: qty 1000

## 2020-10-02 NOTE — Anesthesia Preprocedure Evaluation (Addendum)
Anesthesia Evaluation  Patient identified by MRN, date of birth, ID band Patient awake    Reviewed: Allergy & Precautions, NPO status , Patient's Chart, lab work & pertinent test results  Airway Mallampati: II  TM Distance: >3 FB Neck ROM: Full    Dental no notable dental hx. (+) Partial Upper, Partial Lower, Dental Advisory Given   Pulmonary former smoker,    Pulmonary exam normal breath sounds clear to auscultation       Cardiovascular hypertension, Pt. on medications Normal cardiovascular exam Rhythm:Regular Rate:Normal     Neuro/Psych    GI/Hepatic negative GI ROS, Neg liver ROS,   Endo/Other  negative endocrine ROS  Renal/GU Lab Results      Component                Value               Date                      CREATININE               1.10                10/03/2020                BUN                      19                  10/03/2020                NA                       141                 10/03/2020                K                        3.8                 10/03/2020                CL                       103                 10/03/2020                Musculoskeletal negative musculoskeletal ROS (+)   Abdominal   Peds  Hematology Lab Results      Component                Value               Date                      HGB                      16.3                10/03/2020                HCT                      48.0  10/03/2020              Anesthesia Other Findings   Reproductive/Obstetrics                           Anesthesia Physical Anesthesia Plan  ASA: 2  Anesthesia Plan: General   Post-op Pain Management:    Induction: Intravenous  PONV Risk Score and Plan: 3 and Treatment may vary due to age or medical condition, Midazolam, Dexamethasone and Ondansetron  Airway Management Planned: LMA  Additional Equipment: None  Intra-op Plan:    Post-operative Plan:   Informed Consent: I have reviewed the patients History and Physical, chart, labs and discussed the procedure including the risks, benefits and alternatives for the proposed anesthesia with the patient or authorized representative who has indicated his/her understanding and acceptance.     Dental advisory given  Plan Discussed with: CRNA and Anesthesiologist  Anesthesia Plan Comments: (LMA GA)       Anesthesia Quick Evaluation

## 2020-10-02 NOTE — Progress Notes (Signed)
Spoke w/ via phone for pre-op interview--- Pt Lab needs dos----  Istat and EKG             Lab results------ no COVID test ----- negative covid test result done 10-01-2020 in epic Arrive at ------- 1130 on 10-03-2020 NPO after MN NO Solid Food.  Clear liquids from MN until--- 1030 Med rec completed Medications to take morning of surgery ----- Lipitor Diabetic medication ----- n/a Patient instructed no nail polish to be worn day of surgery Patient instructed to bring photo id and insurance card day of surgery Patient aware to have Driver (ride ) / caregiver for 24 hours after surgery --wife, Zanzella Patient Special Instructions ----- reviewed [facility] and visitor guidelines Pre-Op special Istructions ----- n/a Patient verbalized understanding of instructions that were given at this phone interview. Patient denies shortness of breath, chest pain, fever, cough at this phone interview.

## 2020-10-03 ENCOUNTER — Ambulatory Visit (HOSPITAL_BASED_OUTPATIENT_CLINIC_OR_DEPARTMENT_OTHER): Payer: 59 | Admitting: Anesthesiology

## 2020-10-03 ENCOUNTER — Other Ambulatory Visit: Payer: Self-pay

## 2020-10-03 ENCOUNTER — Observation Stay (HOSPITAL_BASED_OUTPATIENT_CLINIC_OR_DEPARTMENT_OTHER)
Admission: RE | Admit: 2020-10-03 | Discharge: 2020-10-04 | Disposition: A | Discharge status: Home / Self Care | Payer: 59 | Attending: Urology | Admitting: Urology

## 2020-10-03 ENCOUNTER — Encounter (HOSPITAL_BASED_OUTPATIENT_CLINIC_OR_DEPARTMENT_OTHER): Payer: Self-pay | Admitting: Urology

## 2020-10-03 ENCOUNTER — Encounter (HOSPITAL_BASED_OUTPATIENT_CLINIC_OR_DEPARTMENT_OTHER)
Admission: RE | Disposition: A | Discharge status: Home / Self Care | Payer: Self-pay | Source: Home / Self Care | Attending: Urology

## 2020-10-03 DIAGNOSIS — N5201 Erectile dysfunction due to arterial insufficiency: Principal | ICD-10-CM | POA: Insufficient documentation

## 2020-10-03 DIAGNOSIS — F17200 Nicotine dependence, unspecified, uncomplicated: Secondary | ICD-10-CM | POA: Insufficient documentation

## 2020-10-03 DIAGNOSIS — Z7982 Long term (current) use of aspirin: Secondary | ICD-10-CM | POA: Diagnosis not present

## 2020-10-03 DIAGNOSIS — N529 Male erectile dysfunction, unspecified: Secondary | ICD-10-CM | POA: Diagnosis present

## 2020-10-03 HISTORY — DX: Male erectile dysfunction, unspecified: N52.9

## 2020-10-03 HISTORY — PX: PENILE PROSTHESIS IMPLANT: SHX240

## 2020-10-03 LAB — HEMOGLOBIN AND HEMATOCRIT, BLOOD
HCT: 43.6 % (ref 39.0–52.0)
Hemoglobin: 14 g/dL (ref 13.0–17.0)

## 2020-10-03 LAB — POCT I-STAT, CHEM 8
BUN: 19 mg/dL (ref 8–23)
Calcium, Ion: 1.28 mmol/L (ref 1.15–1.40)
Chloride: 103 mmol/L (ref 98–111)
Creatinine, Ser: 1.1 mg/dL (ref 0.61–1.24)
Glucose, Bld: 99 mg/dL (ref 70–99)
HCT: 48 % (ref 39.0–52.0)
Hemoglobin: 16.3 g/dL (ref 13.0–17.0)
Potassium: 3.8 mmol/L (ref 3.5–5.1)
Sodium: 141 mmol/L (ref 135–145)
TCO2: 25 mmol/L (ref 22–32)

## 2020-10-03 SURGERY — INSERTION, PENILE PROSTHESIS, INFLATABLE
Anesthesia: General | Site: Penis

## 2020-10-03 MED ORDER — GENTAMICIN SULFATE 40 MG/ML IJ SOLN
5.0000 mg/kg | INTRAVENOUS | Status: DC
  Filled 2020-10-03: qty 10.75

## 2020-10-03 MED ORDER — OXYCODONE HCL 5 MG PO TABS
5.0000 mg | ORAL_TABLET | ORAL | Status: DC | PRN
  Administered 2020-10-03 – 2020-10-04 (×2): 5 mg via ORAL

## 2020-10-03 MED ORDER — EPHEDRINE 5 MG/ML INJ
INTRAVENOUS | Status: AC
  Filled 2020-10-03: qty 5

## 2020-10-03 MED ORDER — LACTATED RINGERS IV SOLN: INTRAVENOUS | Status: DC

## 2020-10-03 MED ORDER — ATORVASTATIN CALCIUM 20 MG PO TABS: 20.0000 mg | ORAL_TABLET | Freq: Every day | ORAL | Status: DC

## 2020-10-03 MED ORDER — DEXTROSE-NACL 5-0.45 % IV SOLN: INTRAVENOUS | Status: DC

## 2020-10-03 MED ORDER — PROPOFOL 10 MG/ML IV BOLUS
INTRAVENOUS | Status: AC
  Filled 2020-10-03: qty 20

## 2020-10-03 MED ORDER — DEXMEDETOMIDINE (PRECEDEX) IN NS 20 MCG/5ML (4 MCG/ML) IV SYRINGE
PREFILLED_SYRINGE | INTRAVENOUS | Status: DC | PRN
  Administered 2020-10-03 (×2): 4 ug via INTRAVENOUS
  Administered 2020-10-03: 12 ug via INTRAVENOUS

## 2020-10-03 MED ORDER — AMLODIPINE BESYLATE 10 MG PO TABS
10.0000 mg | ORAL_TABLET | Freq: Every day | ORAL | Status: DC
  Administered 2020-10-03: 10 mg via ORAL
  Filled 2020-10-03: qty 1

## 2020-10-03 MED ORDER — OXYCODONE HCL 5 MG/5ML PO SOLN
5.0000 mg | Freq: Once | ORAL | Status: DC | PRN
Start: 2020-10-03 — End: 2020-10-03

## 2020-10-03 MED ORDER — ONDANSETRON HCL 4 MG/2ML IJ SOLN
INTRAMUSCULAR | Status: AC
  Filled 2020-10-03: qty 2

## 2020-10-03 MED ORDER — PHENYLEPHRINE 40 MCG/ML (10ML) SYRINGE FOR IV PUSH (FOR BLOOD PRESSURE SUPPORT)
PREFILLED_SYRINGE | INTRAVENOUS | Status: DC | PRN
  Administered 2020-10-03: 120 ug via INTRAVENOUS
  Administered 2020-10-03 (×2): 80 ug via INTRAVENOUS
  Administered 2020-10-03: 120 ug via INTRAVENOUS

## 2020-10-03 MED ORDER — OXYCODONE HCL 5 MG PO TABS: 5.0000 mg | ORAL_TABLET | Freq: Once | ORAL | Status: DC | PRN

## 2020-10-03 MED ORDER — KETOROLAC TROMETHAMINE 15 MG/ML IJ SOLN
15.0000 mg | Freq: Three times a day (TID) | INTRAMUSCULAR | Status: DC
  Administered 2020-10-04: 15 mg via INTRAVENOUS

## 2020-10-03 MED ORDER — ACETAMINOPHEN 500 MG PO TABS
1000.0000 mg | ORAL_TABLET | Freq: Four times a day (QID) | ORAL | Status: DC
  Administered 2020-10-03 – 2020-10-04 (×2): 1000 mg via ORAL

## 2020-10-03 MED ORDER — ACETAMINOPHEN 10 MG/ML IV SOLN
INTRAVENOUS | Status: DC | PRN
Start: 2020-10-03 — End: 2020-10-03
  Administered 2020-10-03: 1000 mg via INTRAVENOUS

## 2020-10-03 MED ORDER — GLYCOPYRROLATE PF 0.2 MG/ML IJ SOSY
PREFILLED_SYRINGE | INTRAMUSCULAR | Status: AC
  Filled 2020-10-03: qty 1

## 2020-10-03 MED ORDER — HYDROMORPHONE HCL 1 MG/ML IJ SOLN: 0.2500 mg | INTRAMUSCULAR | Status: DC | PRN

## 2020-10-03 MED ORDER — WATER FOR IRRIGATION, STERILE IR SOLN
Status: DC | PRN
  Administered 2020-10-03: 1000 mL

## 2020-10-03 MED ORDER — BACITRACIN ZINC 500 UNIT/GM EX OINT
TOPICAL_OINTMENT | CUTANEOUS | Status: DC | PRN
  Administered 2020-10-03: 1 via TOPICAL

## 2020-10-03 MED ORDER — EPHEDRINE SULFATE-NACL 50-0.9 MG/10ML-% IV SOSY
PREFILLED_SYRINGE | INTRAVENOUS | Status: DC | PRN
  Administered 2020-10-03: 10 mg via INTRAVENOUS

## 2020-10-03 MED ORDER — MIDAZOLAM HCL 2 MG/2ML IJ SOLN
INTRAMUSCULAR | Status: AC
  Filled 2020-10-03: qty 2

## 2020-10-03 MED ORDER — CEFAZOLIN SODIUM-DEXTROSE 2-4 GM/100ML-% IV SOLN
2.0000 g | INTRAVENOUS | Status: AC
  Administered 2020-10-03: 2 g via INTRAVENOUS

## 2020-10-03 MED ORDER — PROPOFOL 10 MG/ML IV BOLUS
INTRAVENOUS | Status: DC | PRN
  Administered 2020-10-03: 150 mg via INTRAVENOUS

## 2020-10-03 MED ORDER — FENTANYL CITRATE (PF) 100 MCG/2ML IJ SOLN
INTRAMUSCULAR | Status: AC
  Filled 2020-10-03: qty 2

## 2020-10-03 MED ORDER — CEFAZOLIN SODIUM-DEXTROSE 2-4 GM/100ML-% IV SOLN
INTRAVENOUS | Status: AC
  Filled 2020-10-03: qty 100

## 2020-10-03 MED ORDER — LIDOCAINE 2% (20 MG/ML) 5 ML SYRINGE
INTRAMUSCULAR | Status: DC | PRN
  Administered 2020-10-03: 100 mg via INTRAVENOUS

## 2020-10-03 MED ORDER — GLYCOPYRROLATE PF 0.2 MG/ML IJ SOSY
PREFILLED_SYRINGE | INTRAMUSCULAR | Status: DC | PRN
  Administered 2020-10-03: .2 mg via INTRAVENOUS

## 2020-10-03 MED ORDER — MIDAZOLAM HCL 2 MG/2ML IJ SOLN
INTRAMUSCULAR | Status: DC | PRN
  Administered 2020-10-03: 2 mg via INTRAVENOUS

## 2020-10-03 MED ORDER — ACETAMINOPHEN 10 MG/ML IV SOLN: 1000.0000 mg | Freq: Once | INTRAVENOUS | Status: DC | PRN

## 2020-10-03 MED ORDER — LIDOCAINE HCL (PF) 2 % IJ SOLN
INTRAMUSCULAR | Status: AC
  Filled 2020-10-03: qty 5

## 2020-10-03 MED ORDER — KETOROLAC TROMETHAMINE 30 MG/ML IJ SOLN
INTRAMUSCULAR | Status: DC | PRN
  Administered 2020-10-03: 30 mg via INTRAVENOUS

## 2020-10-03 MED ORDER — DEXAMETHASONE SODIUM PHOSPHATE 10 MG/ML IJ SOLN
INTRAMUSCULAR | Status: DC | PRN
  Administered 2020-10-03: 10 mg via INTRAVENOUS

## 2020-10-03 MED ORDER — OXYCODONE HCL 5 MG PO TABS
ORAL_TABLET | ORAL | Status: AC
  Filled 2020-10-03: qty 1

## 2020-10-03 MED ORDER — ONDANSETRON HCL 4 MG/2ML IJ SOLN
INTRAMUSCULAR | Status: DC | PRN
  Administered 2020-10-03: 4 mg via INTRAVENOUS

## 2020-10-03 MED ORDER — CEFAZOLIN SODIUM-DEXTROSE 1-4 GM/50ML-% IV SOLN
INTRAVENOUS | Status: AC
  Filled 2020-10-03: qty 50

## 2020-10-03 MED ORDER — AMISULPRIDE (ANTIEMETIC) 5 MG/2ML IV SOLN: 10.0000 mg | Freq: Once | INTRAVENOUS | Status: DC | PRN

## 2020-10-03 MED ORDER — KETOROLAC TROMETHAMINE 30 MG/ML IJ SOLN
INTRAMUSCULAR | Status: AC
  Filled 2020-10-03: qty 1

## 2020-10-03 MED ORDER — GENTAMICIN SULFATE 40 MG/ML IJ SOLN
5.0000 mg/kg | INTRAVENOUS | Status: AC
  Administered 2020-10-03: 430 mg via INTRAVENOUS
  Filled 2020-10-03: qty 10.75

## 2020-10-03 MED ORDER — ONDANSETRON HCL 4 MG/2ML IJ SOLN: 4.0000 mg | INTRAMUSCULAR | Status: DC | PRN

## 2020-10-03 MED ORDER — FENTANYL CITRATE (PF) 100 MCG/2ML IJ SOLN
INTRAMUSCULAR | Status: DC | PRN
  Administered 2020-10-03: 25 ug via INTRAVENOUS
  Administered 2020-10-03 (×2): 50 ug via INTRAVENOUS
  Administered 2020-10-03: 25 ug via INTRAVENOUS

## 2020-10-03 MED ORDER — ACETAMINOPHEN 500 MG PO TABS
ORAL_TABLET | ORAL | Status: AC
  Filled 2020-10-03: qty 2

## 2020-10-03 MED ORDER — ACETAMINOPHEN 10 MG/ML IV SOLN
INTRAVENOUS | Status: AC
  Filled 2020-10-03: qty 100

## 2020-10-03 MED ORDER — GENTAMICIN SULFATE 40 MG/ML IJ SOLN
5.0000 mg/kg | INTRAVENOUS | Status: DC
  Filled 2020-10-03: qty 12.25

## 2020-10-03 MED ORDER — DEXAMETHASONE SODIUM PHOSPHATE 10 MG/ML IJ SOLN
INTRAMUSCULAR | Status: AC
  Filled 2020-10-03: qty 1

## 2020-10-03 MED ORDER — AMLODIPINE BESY-BENAZEPRIL HCL 10-40 MG PO CAPS: 1.0000 | ORAL_CAPSULE | Freq: Every day | ORAL | Status: DC

## 2020-10-03 MED ORDER — SODIUM CHLORIDE (PF) 0.9 % IJ SOLN
INTRAMUSCULAR | Status: DC | PRN
Start: 2020-10-03 — End: 2020-10-03
  Administered 2020-10-03: 250 mL

## 2020-10-03 MED ORDER — BENAZEPRIL HCL 40 MG PO TABS
40.0000 mg | ORAL_TABLET | Freq: Every day | ORAL | Status: DC
  Administered 2020-10-03: 40 mg via ORAL
  Filled 2020-10-03: qty 1

## 2020-10-03 MED ORDER — DEXMEDETOMIDINE (PRECEDEX) IN NS 20 MCG/5ML (4 MCG/ML) IV SYRINGE
PREFILLED_SYRINGE | INTRAVENOUS | Status: AC
  Filled 2020-10-03: qty 5

## 2020-10-03 MED ORDER — CEFAZOLIN SODIUM-DEXTROSE 1-4 GM/50ML-% IV SOLN
1.0000 g | Freq: Three times a day (TID) | INTRAVENOUS | Status: AC
  Administered 2020-10-03 – 2020-10-04 (×2): 1 g via INTRAVENOUS

## 2020-10-03 MED ORDER — ONDANSETRON HCL 4 MG/2ML IJ SOLN: 4.0000 mg | Freq: Once | INTRAMUSCULAR | Status: DC | PRN

## 2020-10-03 SURGICAL SUPPLY — 70 items
ADH SKN CLS APL DERMABOND .7 (GAUZE/BANDAGES/DRESSINGS) ×1
APL PRP STRL LF DISP 70% ISPRP (MISCELLANEOUS) ×1
APL SKNCLS STERI-STRIP NONHPOA (GAUZE/BANDAGES/DRESSINGS)
APL SWBSTK 6 STRL LF DISP (MISCELLANEOUS)
APPLICATOR COTTON TIP 6 STRL (MISCELLANEOUS) IMPLANT
APPLICATOR COTTON TIP 6IN STRL (MISCELLANEOUS)
BAG DECANTER FOR FLEXI CONT (MISCELLANEOUS) ×2 IMPLANT
BAG DRN RND TRDRP ANRFLXCHMBR (UROLOGICAL SUPPLIES) ×1
BAG URINE DRAIN 2000ML AR STRL (UROLOGICAL SUPPLIES) ×2 IMPLANT
BENZOIN TINCTURE PRP APPL 2/3 (GAUZE/BANDAGES/DRESSINGS) IMPLANT
BLADE CLIPPER SENSICLIP SURGIC (BLADE) IMPLANT
BLADE HEX COATED 2.75 (ELECTRODE) ×1 IMPLANT
BLADE SURG 15 STRL LF DISP TIS (BLADE) ×2 IMPLANT
BLADE SURG 15 STRL SS (BLADE) ×4
BNDG COHESIVE 2X5 TAN ST LF (GAUZE/BANDAGES/DRESSINGS) IMPLANT
BNDG GAUZE ELAST 4 BULKY (GAUZE/BANDAGES/DRESSINGS) ×2 IMPLANT
CANISTER SUCT 1200ML W/VALVE (MISCELLANEOUS) IMPLANT
CATH FOLEY 2WAY SLVR  5CC 16FR (CATHETERS) ×2
CATH FOLEY 2WAY SLVR 5CC 16FR (CATHETERS) ×1 IMPLANT
CHLORAPREP W/TINT 26 (MISCELLANEOUS) ×2 IMPLANT
COVER BACK TABLE 60X90IN (DRAPES) ×2 IMPLANT
COVER MAYO STAND STRL (DRAPES) ×4 IMPLANT
DERMABOND ADVANCED (GAUZE/BANDAGES/DRESSINGS) ×1
DERMABOND ADVANCED .7 DNX12 (GAUZE/BANDAGES/DRESSINGS) ×1 IMPLANT
DISSECTOR ROUND CHERRY 3/8 STR (MISCELLANEOUS) IMPLANT
DRAPE INCISE IOBAN 66X45 STRL (DRAPES) ×2 IMPLANT
DRAPE LAPAROTOMY TRNSV 102X78 (DRAPES) ×2 IMPLANT
DRSG TEGADERM 4X4.75 (GAUZE/BANDAGES/DRESSINGS) ×1 IMPLANT
DRSG TELFA 3X8 NADH (GAUZE/BANDAGES/DRESSINGS) IMPLANT
ELECT REM PT RETURN 9FT ADLT (ELECTROSURGICAL) ×2
ELECTRODE REM PT RTRN 9FT ADLT (ELECTROSURGICAL) ×1 IMPLANT
GAUZE 4X4 16PLY ~~LOC~~+RFID DBL (SPONGE) ×2 IMPLANT
GOWN STRL REUS W/TWL XL LVL3 (GOWN DISPOSABLE) ×2 IMPLANT
HOLDER FOLEY CATH W/STRAP (MISCELLANEOUS) ×2 IMPLANT
KIT TITAN ASSEMBLY (Erectile Restoration) ×2 IMPLANT
KIT TITAN ASSEMBLY STANDARD (Erectile Restoration) ×1 IMPLANT
KIT TITAN ASSEMBLY STD (Erectile Restoration) IMPLANT
KIT TURNOVER CYSTO (KITS) ×2 IMPLANT
NS IRRIG 500ML POUR BTL (IV SOLUTION) ×3 IMPLANT
PACK BASIN DAY SURGERY FS (CUSTOM PROCEDURE TRAY) ×2 IMPLANT
PAD DRESSING TELFA 3X8 NADH (GAUZE/BANDAGES/DRESSINGS) ×1 IMPLANT
PENCIL SMOKE EVACUATOR (MISCELLANEOUS) ×2 IMPLANT
PLUG CATH AND CAP STER (CATHETERS) ×2 IMPLANT
PROS TITAN INFRA 0 ANG 20CM (Erectile Restoration) ×2 IMPLANT
PROSTHESIS TTN INFR 0 ANG 20CM (Erectile Restoration) IMPLANT
RESERVOIR 75CC LOCKOUT BIOFLEX (Erectile Restoration) ×1 IMPLANT
RETRACTOR STERILE 25.8CMX11.3 (INSTRUMENTS) IMPLANT
SPONGE T-LAP 4X18 ~~LOC~~+RFID (SPONGE) ×2 IMPLANT
STRIP CLOSURE SKIN 1/2X4 (GAUZE/BANDAGES/DRESSINGS) IMPLANT
SUPPORT SCROTAL LG STRP (MISCELLANEOUS) ×1 IMPLANT
SURGILUBE 2OZ TUBE FLIPTOP (MISCELLANEOUS) ×2 IMPLANT
SUT CHROMIC 4 0 PS 2 18 (SUTURE) ×1 IMPLANT
SUT MNCRL AB 4-0 PS2 18 (SUTURE) ×2 IMPLANT
SUT PDS AB 4-0 RB1 27 (SUTURE) ×1 IMPLANT
SUT VIC AB 2-0 SH 27 (SUTURE)
SUT VIC AB 2-0 SH 27XBRD (SUTURE) IMPLANT
SUT VIC AB 2-0 UR5 27 (SUTURE) ×14 IMPLANT
SUT VIC AB 3-0 SH 27 (SUTURE) ×2
SUT VIC AB 3-0 SH 27X BRD (SUTURE) IMPLANT
SUT VIC AB 5-0 PS2 18 (SUTURE) IMPLANT
SUT VICRYL 0 27 CT2 27 ABS (SUTURE) ×2 IMPLANT
SYR 10ML LL (SYRINGE) ×2 IMPLANT
SYR 20ML LL LF (SYRINGE) ×2 IMPLANT
SYR 50ML LL SCALE MARK (SYRINGE) ×4 IMPLANT
SYR BULB IRRIG 60ML STRL (SYRINGE) ×3 IMPLANT
TOWEL OR 17X26 10 PK STRL BLUE (TOWEL DISPOSABLE) ×4 IMPLANT
TRAY DSU PREP LF (CUSTOM PROCEDURE TRAY) ×2 IMPLANT
TUBE CONNECTING 12X1/4 (SUCTIONS) ×2 IMPLANT
WATER STERILE IRR 500ML POUR (IV SOLUTION) ×2 IMPLANT
YANKAUER SUCT BULB TIP NO VENT (SUCTIONS) ×2 IMPLANT

## 2020-10-03 NOTE — Op Note (Signed)
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 Krishay Faro, MD  Asst: Cherlyn Labella, MD  INDICATION: He has had long-standing organic erectile dysfunction and has been treated with, unsuccessfully, with oral agents and intracavernosal injection therapy without success. He has elected to proceed with prosthesis implantation.  ANESTHESIA:  General  EBL:  Minimal  Device: 3 piece Titan: 75 cc reservoir, 20 cm cylinders and 3 cm rear-tip extenders on right and left sides  LOCAL MEDICATIONS USED:  None  SPECIMEN: None  DISPOSITION OF SPECIMEN:  N/A  Description of procedure: The patient was taken to the major operating room, placed on the table and administered general anesthesia in the supine position. His genitalia was then scrubbed for 10 minutes, painted and then sterilely draped. An official timeout was then performed.  A Toomey syringe was utilized to irrigate the urethra with Betadine.  A 16 French Foley catheter was then placed in the bladder and the bladder was drained and the catheter was plugged. A midline infrapubic incision was then made and then blunt dissection was used to expose the corpora cavernosa bilaterally. This was further cleared of overlying tissue using sharp technique. 2-0 Vicryl sutures were then placed anterolaterally in the corpora cavernosa to serve as stay sutures and then an incision was then made in the corpus cavernosum bilaterally.  In dissecting, it was difficult to know whether or not we were in the corporal bodies bilaterally.  After some time of not being able access the corporal bodies, I made a counterincision in the distal penis dorsally along the circumcision wound.  Dissection was carried down to the tunica bilaterally, staying away from the midline.  Corporotomies were then made in a  transverse direction bilaterally.  I then dilated the corpus cavernosum with the Hegar dilators in the distal and proximal direction starting at 8 and progressing to 12. I then irrigated the corpora cavernosa with antibiotic solution and measured the distance proximally and distally from the stay suture (right and left sides) and were found to be 12 and 11 cm, respectively.I It was irrigated with antibiotic solution as was the scrotum. I then chose an 20 cm cylinder set with 3 cm rear-tip extenders and these were prepped while I prepared the site for reservoir placement.  Incision was then made in the lower rectus fascia.  I  then developed a space behind the symphysis pubis for the reservoir. I irrigated the space with anastomotic solution and then placed the reservoir in this location. I then filled the reservoir with 75 cc of sterile saline.  At this point, the distal corporotomies were closed using interrupted sutures of 2-0 Vicryl.  Attention was redirected to the proximal  corporotomies where the cylinders were then placed by first fixing the suture to the distal aspect of the right cylinder to a straight needle. This was then loaded on the Inov8 Surgical inserter and passed through the corporotomy and distally. I then advanced the straight needle with the Furlow inserter out through the glans and this was grasped with a hemostat and pulled through the glans and the suture was secured with a hemostat. I then performed an identical maneuver on the contralateral side. After this was performed I irrigated both corpus cavernosum and inserted the distal portion of the cylinder through the corporotomies and pulled this to the end of the corpora with the suture. The  proximal aspect with the rear-tip extender was then passed through the corporotomy and into the seated position on each side. I then connected reservoir tubing to a syringe filled with sterile saline and inflated the device. I noted a good straight erection  with both cylinders equidistant under the glans and no buckling of the cylinders. I therefore deflated the device and closed the corporotomies with running 2-0 Vicryl suture. The cylinder was then connected to the pump after excising the excess tubing with appropriate shodded hemostats in place and then I used the supplied connectors to make the connection. I then again cycled the device with the pump and it cycled properly. I deflated the device and pumped it up about three quarters of the way to aid with hemostasis. I irrigated the scrotum once again with metabolic solution.  I then developed the scrotal pocket in the left hemiscrotum in placed the pump in this location. I irrigated the wound one last time with antibiotic irrigation and then closed the deep scrotal tissue over the tubing and pump with running 2-0 vicryl suture. A second layer was then closed over this first layer with running 2- 0 vicryl, and running subcutcular suture w/ 4-0 monocryl performed.  The counterincision was closed first with a running 3-0 Vicryl and then a running 4-0 chromic on the skin.  Fluff dressing placed over scrotum/penis and supporter placed. The catheter was connected to closed system drainage and the patient was awakened and taken recovery room in stable and satisfactory condition. He tolerated the procedure well and there were no intraoperative complications. Needle sponge and instrument counts were correct at the end of the operation.

## 2020-10-03 NOTE — H&P (Signed)
CC: I am having trouble with my erections.  HPI: Joseph Camacho is a 60 year-old male established patient who is here for erectile dysfunction.    He comes in today to discuss further management of his erectile dysfunction. Oral PDE 5 inhibitors do not work. He is on alprostadil 40 micro g at a time. This produces minimal erections in significant amount of pain. He does not want to proceed with more inter cavernosal injections. He would like to discuss placement of inflatable penile prosthesis. He had has done some research on this.     CC/HPI: I have pain in the abdomen.    ALLERGIES: No Allergies    MEDICATIONS: Alprostadil 1 syringe intracorporeal when necessary  Alprostadil 40 mcg Intracavernosal Q1WK  Amlodipine Besylate-Benazepril 10 mg-20 mg capsule Oral  Aspirin 325 mg tablet Oral  Atorvastatin Calcium 80 mg tablet Oral  Prostaglandin E1 40ug/mL 0     GU PSH: Hydrocele repair - Jul 04, 2013       PSH Notes: Surgery Tunica Vaginalis Excision Of Hydrocele Unilateral, Appendectomy   NON-GU PSH: Appendectomy - 07/04/2013     GU PMH: ED due to arterial insufficiency - 03/10/2019, He has been on prostaglandin injections in the past., - 2015/07/05 Hydrocele, Large right hydrocele that is not terribly bothersome. 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 Discussed increased hydration and time/double voiding to help limit recurrence of this finding. Mild pyuria with some bacteria in urine today but given patient's presentation and current symptoms, I did not recommend additional evaluation or treatment at this time. - 12/28/2017 Encounter for general  adult medical examination without abnormal findings, Encounter for preventive health examination - 04-Jul-2013 Personal history of other diseases of the circulatory system, History of hypertension - Jul 04, 2013 Personal history of other endocrine, nutritional and metabolic disease, History of hypercholesterolemia - Jul 04, 2013    FAMILY HISTORY: Death - Mother Death of family member - Runs In Family Strokes - Runs In Family   SOCIAL HISTORY: Marital Status: Married Preferred Language: English; Ethnicity: Not Hispanic Or Latino; Race: Black or African American Current Smoking Status: Patient does not smoke anymore. Smoked for 15 years.   Tobacco Use Assessment Completed: Used Tobacco in last 30 days? Social Drinker.  Drinks 1 caffeinated drink per day. 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     Notes: Married, Occupation, Current smoker on some days, Caffeine use, Number of children, Alcohol use   REVIEW OF SYSTEMS:    GU Review Male:   Patient denies frequent urination, hard to postpone urination, burning/ pain with urination, get up at night to urinate, leakage of urine, stream starts and stops, trouble starting your stream, have to strain to urinate , erection problems, and penile pain.  Gastrointestinal (Upper):   Patient denies nausea, vomiting, and indigestion/ heartburn.  Gastrointestinal (Lower):   Patient denies diarrhea and constipation.  Constitutional:   Patient denies fever, night sweats, weight loss, and fatigue.  Skin:   Patient denies skin rash/ lesion and itching.  Eyes:   Patient denies blurred vision and double vision.  Ears/ Nose/ Throat:   Patient denies sinus problems and sore throat.  Hematologic/Lymphatic:   Patient denies swollen glands and easy bruising.  Cardiovascular:  Patient denies leg swelling and chest pains.  Respiratory:   Patient denies cough and shortness of breath.  Endocrine:   Patient denies excessive thirst.  Musculoskeletal:   Patient denies back pain and joint  pain.  Neurological:   Patient denies headaches and dizziness.  Psychologic:   Patient denies depression and anxiety.   VITAL SIGNS:      05/01/2020 01:15 PM  Weight 225 lb / 102.06 kg  Height 72 in / 182.88 cm  BP 149/91 mmHg  Pulse 84 /min  Temperature 98.6 F / 37 C  BMI 30.5 kg/m   Complexity of Data:  Source Of History:  Patient  Records Review:   Previous Patient Records   PROCEDURES: None   ASSESSMENT:      ICD-10 Details  1 GU:   ED due to arterial insufficiency - N52.01 Chronic, Worsening - E.d. is worsening, he does not respond to oral medications or injectables, which also produce pain.     PLAN:           Document Letter(s):  Created for Patient: Clinical Summary         Notes:   I discussed placement of inflatable penile prosthesis with the patient. 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 I discussed risks and complications of the procedure, including but not limited to bleeding, infection/ explantation of the prosthesis, mechanical malfunction, pain following the procedure, hematoma, among other issues. He understands these. He would like to proceed if he does have insurance coverage.

## 2020-10-03 NOTE — Progress Notes (Signed)
Pharmacy Antibiotic Note  LEEANDRE NORDLING is a 60 y.o. male admitted on 10/03/2020 for inflatable penile prosthesis surgical placement.  Pharmacy has been consulted for Gentamicin dosing for surgical prophylaxis.  Gentamicin, Vancomycin, and cefazolin given preop today.    Plan: Gentamicin 5 mg/kg (Adjusted weight) 430 mg q24h Follow up duration of post-procedure prophylaxis.    Height: 6' (182.9 cm) Weight: 97.7 kg (215 lb 4.8 oz) IBW/kg (Calculated) : 77.6  Temp (24hrs), Avg:98 F (36.7 C), Min:97.8 F (36.6 C), Max:98.1 F (36.7 C)  Recent Labs  Lab 10/03/20 1154  CREATININE 1.10    Estimated Creatinine Clearance: 85.4 mL/min (by C-G formula based on SCr of 1.1 mg/dL).    No Known Allergies  Antimicrobials this admission: 8/4 Vancomycin x1 8/4 Cefazolin >> 8/5 8/4 Gentamicin >>   Dose adjustments this admission:   Microbiology results:   Thank you for allowing pharmacy to be a part of this patient's care. Lynann Beaver PharmD, BCPS Clinical Pharmacist [facility] main pharmacy [phone] 10/03/2020 6:14 PM

## 2020-10-03 NOTE — Discharge Instructions (Addendum)
Penile prosthesis postoperative instructions     Important: You have been prescribed 5 days of Bactrim (antibiotic) that you should begin today and complete  Wound:  In most cases your incision will have absorbable sutures that will dissolve within the first 10-20 days. Some will fall out even earlier. Expect some redness as the sutures dissolved but this should occur only around the sutures. If there is generalized redness, especially with increasing pain or swelling, let us know. The scrotum and penis will very likely get "black and blue" as the blood in the tissues spread. Sometimes the whole scrotum will turn colors. The black and blue is followed by a yellow and brown color. In time, all the discoloration will go away. In some cases some firm swelling in the area of the testicle and pump may persist for up to 4-6 weeks after the surgery and is considered normal in most cases.  Diet:  You may return to your normal diet within 24 hours following your surgery. You may note some mild nausea and possibly vomiting the first 6-8 hours following surgery. This is usually due to the side effects of anesthesia, and will disappear quite soon. I would suggest clear liquids and a very light meal the first evening following your surgery.  Activity:  Your physical activity should be restricted the first 48 hours. During that time you should remain relatively inactive, moving about only when necessary. During the first 7-10 days following surgery he should avoid lifting any heavy objects (anything greater than 15 pounds), and avoid strenuous exercise. If you work, ask [place] specifically about your restrictions, both for work and home. We will write a note to your employer if needed.  You should plan to wear a tight pair of jockey shorts or an athletic supporter for the first 4-5 days, even to sleep. This will keep the scrotum immobilized to some degree and keep the swelling down.The position of your penis will  determine what is most comfortable but I strongly urge you to keep the penis in the "up" position (toward your head).  Ice packs should be placed on and off over the scrotum for the first 48 hours. Frozen peas or corn in a ZipLock bag can be frozen, used and re-frozen. Fifteen minutes on and 15 minutes off is a reasonable schedule. The ice is a good pain reliever and keeps the swelling down.  Hygiene:  You may shower 48 hours after your surgery. Tub bathing should be restricted until the seventh day.  Medication:  You will be sent home with some type of pain medication. In many cases you will be sent home with a narcotic pain pill (hydrocodone or oxycodone). If the pain is not too bad, you may take either Tylenol (acetaminophen) or Advil (ibuprofen) which contain no narcotic agents, and might be tolerated a little better, with fewer side effects. If the pain medication you are sent home with does not control the pain, you will have to let us know. Some narcotic pain medications cannot be given or refilled by a phone call to a pharmacy.  Problems you should report to [place]:  Fever of 101.0 degrees Fahrenheit or greater. Moderate or severe swelling under the skin incision or involving the scrotum. Drug reaction such as hives, a rash, nausea or vomiting.

## 2020-10-03 NOTE — Anesthesia Postprocedure Evaluation (Signed)
Anesthesia Post Note  Patient: Joseph Camacho  Procedure(s) Performed: PENILE PROTHESIS INFLATABLE (Penis)     Patient location during evaluation: PACU Anesthesia Type: General Level of consciousness: awake Pain management: pain level controlled Vital Signs Assessment: post-procedure vital signs reviewed and stable Respiratory status: spontaneous breathing and respiratory function stable Cardiovascular status: stable Postop Assessment: no apparent nausea or vomiting Anesthetic complications: no   No notable events documented.  Last Vitals:  Vitals:   10/03/20 1746 10/03/20 1800  BP: 128/85 132/77  Pulse: 80 69  Resp: 20 14  Temp: 36.6 C 36.7 C  SpO2: 94% 95%    Last Pain:  Vitals:   10/03/20 1800  TempSrc: Oral  PainSc:                  Mellody Dance

## 2020-10-03 NOTE — Interval H&P Note (Signed)
History and Physical Interval Note:  10/03/2020 2:01 PM  Joseph Camacho  has presented today for surgery, with the diagnosis of ERECTILE DYSFUNCTION.  The various methods of treatment have been discussed with the patient and family. After consideration of risks, benefits and other options for treatment, the patient has consented to  Procedure(s): PENILE PROTHESIS INFLATABLE (N/A) as a surgical intervention.  The patient's history has been reviewed, patient examined, no change in status, stable for surgery.  I have reviewed the patient's chart and labs.  Questions were answered to the patient's satisfaction.     Bertram Millard Isaia Hassell

## 2020-10-03 NOTE — Transfer of Care (Signed)
Immediate Anesthesia Transfer of Care Note  Patient: Joseph Camacho  Procedure(s) Performed: Procedure(s) (LRB): PENILE PROTHESIS INFLATABLE (N/A)  Patient Location: PACU  Anesthesia Type: General  Level of Consciousness: awake, alert  and oriented  Airway & Oxygen Therapy: Patient Spontanous Breathing and Patient connected to nasal cannula oxygen  Post-op Assessment: Report given to PACU RN and Post -op Vital signs reviewed and stable  Post vital signs: Reviewed and stable  Complications: No apparent anesthesia complications  Last Vitals:  Vitals Value Taken Time  BP 127/82 10/03/20 1707  Temp 36.7 C 10/03/20 1707  Pulse 74 10/03/20 1714  Resp 17 10/03/20 1714  SpO2 98 % 10/03/20 1714  Vitals shown include unvalidated device data.  Last Pain:  Vitals:   10/03/20 1150  TempSrc: Oral  PainSc: 0-No pain         Complications: No notable events documented.

## 2020-10-03 NOTE — Anesthesia Procedure Notes (Addendum)
Procedure Name: LMA Insertion Date/Time: 10/03/2020 2:11 PM Performed by: Norva Pavlov, CRNA Pre-anesthesia Checklist: Patient identified, Emergency Drugs available, Suction available and Patient being monitored Patient Re-evaluated:Patient Re-evaluated prior to induction Oxygen Delivery Method: Circle system utilized Preoxygenation: Pre-oxygenation with 100% oxygen Induction Type: IV induction Ventilation: Mask ventilation without difficulty LMA: LMA inserted LMA Size: 5.0 Number of attempts: 1 Airway Equipment and Method: Bite block Placement Confirmation: positive ETCO2 Tube secured with: Tape Dental Injury: Teeth and Oropharynx as per pre-operative assessment

## 2020-10-04 ENCOUNTER — Encounter (HOSPITAL_BASED_OUTPATIENT_CLINIC_OR_DEPARTMENT_OTHER): Payer: Self-pay | Admitting: Urology

## 2020-10-04 DIAGNOSIS — N5201 Erectile dysfunction due to arterial insufficiency: Secondary | ICD-10-CM | POA: Diagnosis not present

## 2020-10-04 LAB — HEMOGLOBIN AND HEMATOCRIT, BLOOD
HCT: 43.4 % (ref 39.0–52.0)
Hemoglobin: 14 g/dL (ref 13.0–17.0)

## 2020-10-04 MED ORDER — TRAMADOL HCL 50 MG PO TABS: 50.0000 mg | ORAL_TABLET | Freq: Four times a day (QID) | ORAL | 0 refills | Status: AC | PRN

## 2020-10-04 MED ORDER — KETOROLAC TROMETHAMINE 15 MG/ML IJ SOLN
INTRAMUSCULAR | Status: AC
  Filled 2020-10-04: qty 1

## 2020-10-04 MED ORDER — SULFAMETHOXAZOLE-TRIMETHOPRIM 800-160 MG PO TABS: 1.0000 | ORAL_TABLET | Freq: Two times a day (BID) | ORAL | 0 refills | Status: AC

## 2020-10-04 MED ORDER — OXYCODONE HCL 5 MG PO TABS
ORAL_TABLET | ORAL | Status: AC
  Filled 2020-10-04: qty 1

## 2020-10-04 MED ORDER — CEFAZOLIN SODIUM-DEXTROSE 1-4 GM/50ML-% IV SOLN
INTRAVENOUS | Status: AC
  Filled 2020-10-04: qty 50

## 2020-10-04 MED ORDER — ACETAMINOPHEN 500 MG PO TABS
ORAL_TABLET | ORAL | Status: AC
  Filled 2020-10-04: qty 2

## 2020-10-04 NOTE — Discharge Summary (Signed)
[facility] Discharge Summary  Admit date: 10/03/2020  Discharge date and time: 10/04/20   Discharge to: Home  Discharge Service: Urology  Discharge Attending Physician:  Marcine Matar  Discharge  Diagnoses: <principal problem not specified>  Secondary Diagnosis: Active Problems:   Erectile dysfunction   OR Procedures: Procedure(s): PENILE PROTHESIS INFLATABLE 10/03/2020   Ancillary Procedures: None   Discharge Day Services: The patient was seen and examined by the Urology team both in the morning and immediately prior to discharge.  Vital signs and laboratory values were stable and within normal limits.  The physical exam was benign and unchanged and all surgical wounds were examined.  Discharge instructions were explained and all questions answered.  Subjective  No acute events overnight. Pain Controlled. No fever or chills.  Objective Patient Vitals for the past 8 hrs:  BP Temp Pulse Resp SpO2  10/04/20 0515 129/76 98.1 F (36.7 C) 81 -- 94 %  10/04/20 0120 134/68 98.4 F (36.9 C) 74 18 96 %   Total I/O In: -  Out: 250 [Urine:250]  General: Alert and oriented CV: Regular rate Lungs: No increased work of breathing Abdomen: Soft, appropriately tender. Incisions c/d/i. GU: Foley in place draining clear yellow urine.  Implant is in place and is semirigid as well as left intraoperatively.  Incisions are intact. Ext: NT, No erythema   Hospital Course:  The patient underwent 3 piece inflatable penile prosthesis implantation on 10/03/2020.  The patient tolerated the procedure well, was extubated in the OR, and afterwards was taken to the PACU for routine post-surgical care. When stable the patient was transferred to the floor.   The patient did well postoperatively.  The patient's diet was slowly advanced and at the time of discharge was tolerating a regular diet.  The patient was discharged home 1 Day Post-Op, at which point was tolerating a regular solid diet, was  able to void spontaneously, have adequate pain control with P.O. pain medication, and could ambulate without difficulty. The patient will follow up with [place] for post op check.   5 days bactrim and prn analgesics.   Condition at Discharge: Improved  Discharge Medications:  Allergies as of 10/04/2020   No Known Allergies      Medication List     TAKE these medications    amLODipine-benazepril 10-40 MG capsule Commonly known as: LOTREL Take 1 capsule by mouth daily.   atorvastatin 20 MG tablet Commonly known as: LIPITOR Take 20 mg by mouth daily.   PRESCRIPTION MEDICATION Take 1 capsule by mouth 2 (two) times daily. Per pt taking antibiotic, did not know name   sulfamethoxazole-trimethoprim 800-160 MG tablet Commonly known as: BACTRIM DS Take 1 tablet by mouth 2 (two) times daily for 5 days.

## 2020-10-04 NOTE — Progress Notes (Signed)
Urology Progress Note   1 Day Post-Op from three-piece inflatable prosthetic penile implant for erectile dysfunction.   Subjective: NAEON.  Quired only 1 dose of as needed oxycodone overnight.  Vital signs are stable.  Adequate urine output.  Hemoglobin is stable.  Foley catheter draining clear yellow.  Objective: Vital signs in last 24 hours: Temp:  [97.8 F (36.6 C)-98.4 F (36.9 C)] 98.1 F (36.7 C) (08/05 0515) Pulse Rate:  [69-89] 81 (08/05 0515) Resp:  [14-22] 18 (08/05 0120) BP: (119-136)/(68-90) 129/76 (08/05 0515) SpO2:  [94 %-99 %] 94 % (08/05 0515) Weight:  [97.7 kg] 97.7 kg (08/04 1150)  Intake/Output from previous day: 08/04 0701 - 08/05 0700 In: 2427.4 [P.O.:300; I.V.:1816.7; IV Piggyback:310.8] Out: 1225 [Urine:975; Blood:250] Intake/Output this shift: Total I/O In: -  Out: 250 [Urine:250]  Physical Exam:  General: Alert and oriented CV: Regular rate Lungs: No increased work of breathing Abdomen: Soft, appropriately tender. Incisions c/d/i.  GU: Foley in place draining clear yellow urine.  Implant is in place and is semirigid as well as left intraoperatively.  Incisions are intact. Ext: NT, No erythema  Lab Results: Recent Labs    10/03/20 1154 10/03/20 1746 10/04/20 0212  HGB 16.3 14.0 14.0  HCT 48.0 43.6 43.4   Recent Labs    10/03/20 1154  NA 141  K 3.8  CL 103  GLUCOSE 99  BUN 19  CREATININE 1.10    Studies/Results: No results found.  Assessment/Plan:  60 y.o. male s/p IPP placement.  Overall doing well post-op.   -Trial of void today -Stop IV fluids, regular diet  Dispo: Home today   LOS: 0 days
